# Patient Record
Sex: Female | Born: 1950 | Race: White | Hispanic: No | Marital: Married | Smoking: Former smoker
Health system: Southern US, Community
[De-identification: ages and names within clinical notes are randomized; demographics above are authoritative.]

## PROBLEM LIST (undated history)

## (undated) DIAGNOSIS — I10 Essential (primary) hypertension: Secondary | ICD-10-CM

## (undated) DIAGNOSIS — K219 Gastro-esophageal reflux disease without esophagitis: Secondary | ICD-10-CM

## (undated) DIAGNOSIS — E785 Hyperlipidemia, unspecified: Secondary | ICD-10-CM

## (undated) DIAGNOSIS — M1990 Unspecified osteoarthritis, unspecified site: Secondary | ICD-10-CM

## (undated) HISTORY — DX: Hyperlipidemia, unspecified: E78.5

## (undated) HISTORY — PX: TONSILLECTOMY: SUR1361

## (undated) HISTORY — PX: COLONOSCOPY: SHX174

## (undated) HISTORY — DX: Gastro-esophageal reflux disease without esophagitis: K21.9

## (undated) HISTORY — DX: Morbid (severe) obesity due to excess calories: E66.01

## (undated) HISTORY — DX: Essential (primary) hypertension: I10

---

## 1998-10-11 ENCOUNTER — Encounter: Admission: RE | Admit: 1998-10-11 | Discharge: 1998-11-03 | Payer: Self-pay | Admitting: Family Medicine

## 1999-01-05 ENCOUNTER — Other Ambulatory Visit: Admission: RE | Admit: 1999-01-05 | Discharge: 1999-01-05 | Payer: Self-pay | Admitting: Family Medicine

## 2000-04-12 ENCOUNTER — Encounter: Payer: Self-pay | Admitting: Family Medicine

## 2000-04-12 ENCOUNTER — Encounter: Admission: RE | Admit: 2000-04-12 | Discharge: 2000-04-12 | Payer: Self-pay | Admitting: Family Medicine

## 2000-04-16 ENCOUNTER — Other Ambulatory Visit: Admission: RE | Admit: 2000-04-16 | Discharge: 2000-04-16 | Payer: Self-pay | Admitting: Family Medicine

## 2001-01-15 ENCOUNTER — Encounter: Admission: RE | Admit: 2001-01-15 | Discharge: 2001-02-07 | Payer: Self-pay | Admitting: Family Medicine

## 2001-01-22 ENCOUNTER — Encounter: Admission: RE | Admit: 2001-01-22 | Discharge: 2001-04-03 | Payer: Self-pay | Admitting: Family Medicine

## 2001-04-15 ENCOUNTER — Encounter: Admission: RE | Admit: 2001-04-15 | Discharge: 2001-04-15 | Payer: Self-pay | Admitting: Family Medicine

## 2001-04-15 ENCOUNTER — Encounter: Payer: Self-pay | Admitting: Family Medicine

## 2001-04-17 ENCOUNTER — Encounter: Payer: Self-pay | Admitting: Family Medicine

## 2001-04-17 ENCOUNTER — Encounter: Admission: RE | Admit: 2001-04-17 | Discharge: 2001-04-17 | Payer: Self-pay | Admitting: Family Medicine

## 2001-04-18 ENCOUNTER — Other Ambulatory Visit: Admission: RE | Admit: 2001-04-18 | Discharge: 2001-04-18 | Payer: Self-pay | Admitting: Family Medicine

## 2001-05-27 ENCOUNTER — Encounter: Admission: RE | Admit: 2001-05-27 | Discharge: 2001-08-25 | Payer: Self-pay | Admitting: Family Medicine

## 2001-10-07 ENCOUNTER — Encounter: Payer: Self-pay | Admitting: Family Medicine

## 2001-10-07 ENCOUNTER — Encounter: Admission: RE | Admit: 2001-10-07 | Discharge: 2001-10-07 | Payer: Self-pay | Admitting: Family Medicine

## 2002-04-16 ENCOUNTER — Encounter: Admission: RE | Admit: 2002-04-16 | Discharge: 2002-04-16 | Payer: Self-pay | Admitting: Family Medicine

## 2002-04-16 ENCOUNTER — Encounter: Payer: Self-pay | Admitting: Family Medicine

## 2002-04-18 ENCOUNTER — Encounter: Payer: Self-pay | Admitting: Family Medicine

## 2002-04-18 ENCOUNTER — Encounter: Admission: RE | Admit: 2002-04-18 | Discharge: 2002-04-18 | Payer: Self-pay | Admitting: Family Medicine

## 2003-04-20 ENCOUNTER — Encounter: Payer: Self-pay | Admitting: Internal Medicine

## 2003-04-20 ENCOUNTER — Encounter: Admission: RE | Admit: 2003-04-20 | Discharge: 2003-04-20 | Payer: Self-pay | Admitting: Internal Medicine

## 2004-04-22 ENCOUNTER — Encounter: Admission: RE | Admit: 2004-04-22 | Discharge: 2004-04-22 | Payer: Self-pay | Admitting: Internal Medicine

## 2004-06-30 ENCOUNTER — Other Ambulatory Visit: Admission: RE | Admit: 2004-06-30 | Discharge: 2004-06-30 | Payer: Self-pay | Admitting: Internal Medicine

## 2004-07-08 ENCOUNTER — Encounter: Admission: RE | Admit: 2004-07-08 | Discharge: 2004-10-06 | Payer: Self-pay | Admitting: Internal Medicine

## 2004-07-26 ENCOUNTER — Other Ambulatory Visit: Admission: RE | Admit: 2004-07-26 | Discharge: 2004-07-26 | Payer: Self-pay | Admitting: Internal Medicine

## 2004-09-06 ENCOUNTER — Encounter (INDEPENDENT_AMBULATORY_CARE_PROVIDER_SITE_OTHER): Payer: Self-pay | Admitting: *Deleted

## 2004-09-06 ENCOUNTER — Ambulatory Visit (HOSPITAL_COMMUNITY): Admission: RE | Admit: 2004-09-06 | Discharge: 2004-09-06 | Payer: Self-pay | Admitting: Gastroenterology

## 2005-05-10 ENCOUNTER — Encounter: Admission: RE | Admit: 2005-05-10 | Discharge: 2005-05-10 | Payer: Self-pay | Admitting: Internal Medicine

## 2006-01-15 ENCOUNTER — Other Ambulatory Visit: Admission: RE | Admit: 2006-01-15 | Discharge: 2006-01-15 | Payer: Self-pay | Admitting: Internal Medicine

## 2006-02-01 ENCOUNTER — Encounter: Admission: RE | Admit: 2006-02-01 | Discharge: 2006-02-01 | Payer: Self-pay | Admitting: Internal Medicine

## 2006-05-11 ENCOUNTER — Encounter: Admission: RE | Admit: 2006-05-11 | Discharge: 2006-05-11 | Payer: Self-pay | Admitting: Obstetrics and Gynecology

## 2007-05-14 ENCOUNTER — Encounter: Admission: RE | Admit: 2007-05-14 | Discharge: 2007-05-14 | Payer: Self-pay | Admitting: Internal Medicine

## 2007-05-16 ENCOUNTER — Encounter: Admission: RE | Admit: 2007-05-16 | Discharge: 2007-05-16 | Payer: Self-pay | Admitting: Internal Medicine

## 2008-01-03 ENCOUNTER — Emergency Department (HOSPITAL_COMMUNITY): Admission: EM | Admit: 2008-01-03 | Discharge: 2008-01-04 | Payer: Self-pay | Admitting: Emergency Medicine

## 2008-05-25 ENCOUNTER — Encounter: Admission: RE | Admit: 2008-05-25 | Discharge: 2008-05-25 | Payer: Self-pay | Admitting: Internal Medicine

## 2009-05-27 ENCOUNTER — Encounter: Admission: RE | Admit: 2009-05-27 | Discharge: 2009-05-27 | Payer: Self-pay | Admitting: Internal Medicine

## 2010-05-02 ENCOUNTER — Ambulatory Visit: Payer: Self-pay | Admitting: Cardiology

## 2010-05-31 ENCOUNTER — Encounter: Admission: RE | Admit: 2010-05-31 | Discharge: 2010-05-31 | Payer: Self-pay | Admitting: Family Medicine

## 2010-06-02 ENCOUNTER — Encounter: Admission: RE | Admit: 2010-06-02 | Discharge: 2010-06-02 | Payer: Self-pay | Admitting: Family Medicine

## 2010-06-29 ENCOUNTER — Ambulatory Visit: Payer: Self-pay | Admitting: Cardiology

## 2010-09-25 ENCOUNTER — Encounter: Payer: Self-pay | Admitting: Family Medicine

## 2011-01-16 ENCOUNTER — Other Ambulatory Visit: Payer: Self-pay | Admitting: Cardiology

## 2011-01-16 DIAGNOSIS — K219 Gastro-esophageal reflux disease without esophagitis: Secondary | ICD-10-CM

## 2011-01-16 DIAGNOSIS — E785 Hyperlipidemia, unspecified: Secondary | ICD-10-CM

## 2011-01-16 DIAGNOSIS — I10 Essential (primary) hypertension: Secondary | ICD-10-CM

## 2011-01-16 MED ORDER — PRAVASTATIN SODIUM 20 MG PO TABS: 20.0000 mg | ORAL_TABLET | Freq: Every evening | ORAL | Status: DC

## 2011-01-16 MED ORDER — TELMISARTAN 80 MG PO TABS: 80.0000 mg | ORAL_TABLET | Freq: Every day | ORAL | Status: DC

## 2011-01-16 MED ORDER — ESOMEPRAZOLE MAGNESIUM 40 MG PO CPDR: 40.0000 mg | DELAYED_RELEASE_CAPSULE | Freq: Every day | ORAL | Status: DC

## 2011-01-16 NOTE — Telephone Encounter (Signed)
CALL PT ABOUT SOME SCRIPT SHE WANTS TO KNOW IF KELLY CAN TAKE CARE OF.

## 2011-01-20 NOTE — Op Note (Signed)
NAMETAYONNA, BACHA NO.:  [id number]   MEDICAL RECORD NO.:  [id number]          PATIENT TYPE:  AMB   LOCATION:  ENDO                         FACILITY:  [facility]   PHYSICIAN:  Danise Edge, M.D.   DATE OF BIRTH:  10/03/1950   DATE OF PROCEDURE:  09/06/2004  DATE OF DISCHARGE:                                 OPERATIVE REPORT   PROCEDURE:  Screening colonoscopy with polypectomy.   PROCEDURE INDICATION:  Kim Valdez is a 60 year old female born 04/21/1951.  __________ for screening colonoscopy with polypectomy to prevent  colon cancer.   ENDOSCOPIST:  Danise Edge, M.D.   PRE-MEDICATIONS:  Demerol 70 mg, Versed 7 mg.   PROCEDURE:  After obtaining informed consent, Kim Valdez was placed in the  left lateral decubitus position.  After __________ procedure.  The patient's  blood pressure, oxygen saturation and cardiac rhythm were monitored  throughout the procedure and documented in the medical record.   Anal inspection and digital rectal exam were normal.  The Olympus video  pediatric colonoscope was introduced into the rectum and advanced into the  cecum.  Colonic preparation is __________.   Rectum normal.  Sigmoid colon and descending colon were normal.  Splenic  flexure normal.  Transverse colon normal.  Hepatic flexure normal.  Ascending colon:  From the proximal ascending colon, a diminutive 1-mm  sessile polyp was removed with cold biopsy forceps.  Cecum and ileocecal  valve were normal.   ASSESSMENT:  A diminutive polyp was removed from the proximal ascending  colon; otherwise normal proctocolonoscopy to the cecum.      MJ/MEDQ  D:  09/06/2004  T:  09/06/2004  Job:  [id number]

## 2011-05-16 ENCOUNTER — Other Ambulatory Visit: Payer: Self-pay | Admitting: Family Medicine

## 2011-05-16 DIAGNOSIS — Z1231 Encounter for screening mammogram for malignant neoplasm of breast: Secondary | ICD-10-CM

## 2011-06-27 ENCOUNTER — Ambulatory Visit
Admission: RE | Admit: 2011-06-27 | Discharge: 2011-06-27 | Disposition: A | Discharge status: Home / Self Care | Source: Ambulatory Visit | Attending: Family Medicine | Admitting: Family Medicine

## 2011-06-27 DIAGNOSIS — Z1231 Encounter for screening mammogram for malignant neoplasm of breast: Secondary | ICD-10-CM

## 2011-12-06 ENCOUNTER — Encounter: Payer: Self-pay | Admitting: *Deleted

## 2012-07-01 ENCOUNTER — Other Ambulatory Visit: Payer: Self-pay | Admitting: Family Medicine

## 2012-07-01 DIAGNOSIS — Z1231 Encounter for screening mammogram for malignant neoplasm of breast: Secondary | ICD-10-CM

## 2012-07-29 ENCOUNTER — Ambulatory Visit
Admission: RE | Admit: 2012-07-29 | Discharge: 2012-07-29 | Disposition: A | Discharge status: Home / Self Care | Source: Ambulatory Visit | Attending: Family Medicine | Admitting: Family Medicine

## 2012-07-29 DIAGNOSIS — Z1231 Encounter for screening mammogram for malignant neoplasm of breast: Secondary | ICD-10-CM

## 2012-08-02 ENCOUNTER — Other Ambulatory Visit: Payer: Self-pay | Admitting: Family Medicine

## 2012-08-02 DIAGNOSIS — R928 Other abnormal and inconclusive findings on diagnostic imaging of breast: Secondary | ICD-10-CM

## 2012-08-06 ENCOUNTER — Ambulatory Visit
Admission: RE | Admit: 2012-08-06 | Discharge: 2012-08-06 | Disposition: A | Discharge status: Home / Self Care | Payer: 59 | Source: Ambulatory Visit | Attending: Family Medicine | Admitting: Family Medicine

## 2012-08-06 DIAGNOSIS — R928 Other abnormal and inconclusive findings on diagnostic imaging of breast: Secondary | ICD-10-CM

## 2012-11-03 ENCOUNTER — Emergency Department (HOSPITAL_COMMUNITY)
Admission: EM | Admit: 2012-11-03 | Discharge: 2012-11-04 | Disposition: A | Discharge status: Home / Self Care | Payer: 59 | Attending: Emergency Medicine | Admitting: Emergency Medicine

## 2012-11-03 ENCOUNTER — Encounter (HOSPITAL_COMMUNITY): Payer: Self-pay | Admitting: Emergency Medicine

## 2012-11-03 DIAGNOSIS — K5289 Other specified noninfective gastroenteritis and colitis: Secondary | ICD-10-CM | POA: Insufficient documentation

## 2012-11-03 DIAGNOSIS — R197 Diarrhea, unspecified: Secondary | ICD-10-CM | POA: Insufficient documentation

## 2012-11-03 DIAGNOSIS — Z87891 Personal history of nicotine dependence: Secondary | ICD-10-CM | POA: Insufficient documentation

## 2012-11-03 DIAGNOSIS — R112 Nausea with vomiting, unspecified: Secondary | ICD-10-CM | POA: Insufficient documentation

## 2012-11-03 DIAGNOSIS — K529 Noninfective gastroenteritis and colitis, unspecified: Secondary | ICD-10-CM

## 2012-11-03 DIAGNOSIS — K219 Gastro-esophageal reflux disease without esophagitis: Secondary | ICD-10-CM | POA: Insufficient documentation

## 2012-11-03 DIAGNOSIS — Z79899 Other long term (current) drug therapy: Secondary | ICD-10-CM | POA: Insufficient documentation

## 2012-11-03 DIAGNOSIS — E785 Hyperlipidemia, unspecified: Secondary | ICD-10-CM | POA: Insufficient documentation

## 2012-11-03 DIAGNOSIS — I10 Essential (primary) hypertension: Secondary | ICD-10-CM | POA: Insufficient documentation

## 2012-11-03 DIAGNOSIS — Z7982 Long term (current) use of aspirin: Secondary | ICD-10-CM | POA: Insufficient documentation

## 2012-11-03 MED ORDER — SODIUM CHLORIDE 0.9 % IV BOLUS (SEPSIS)
1000.0000 mL | Freq: Once | INTRAVENOUS | Status: AC
  Administered 2012-11-04: 1000 mL via INTRAVENOUS

## 2012-11-03 MED ORDER — AMMONIA AROMATIC IN INHA
RESPIRATORY_TRACT | Status: AC
  Filled 2012-11-03: qty 10

## 2012-11-03 MED ORDER — ONDANSETRON HCL 4 MG/2ML IJ SOLN
4.0000 mg | Freq: Once | INTRAMUSCULAR | Status: AC
  Administered 2012-11-04: 4 mg via INTRAVENOUS
  Filled 2012-11-03: qty 2

## 2012-11-03 NOTE — ED Notes (Signed)
Pt c/o N/Vx2 day,  diarrhea  X3days. Pt took zofran to control nausea and had some relief.

## 2012-11-04 ENCOUNTER — Encounter (HOSPITAL_COMMUNITY): Payer: Self-pay

## 2012-11-04 ENCOUNTER — Emergency Department (HOSPITAL_COMMUNITY): Payer: 59

## 2012-11-04 LAB — COMPREHENSIVE METABOLIC PANEL
Albumin: 3.3 g/dL — ABNORMAL LOW (ref 3.5–5.2)
Alkaline Phosphatase: 90 U/L (ref 39–117)
BUN: 17 mg/dL (ref 6–23)
Calcium: 8.7 mg/dL (ref 8.4–10.5)
Creatinine, Ser: 0.89 mg/dL (ref 0.50–1.10)
GFR calc Af Amer: 79 mL/min — ABNORMAL LOW (ref >90)
Glucose, Bld: 127 mg/dL — ABNORMAL HIGH (ref 70–99)
Total Protein: 7.3 g/dL (ref 6.0–8.3)

## 2012-11-04 LAB — CBC WITH DIFFERENTIAL/PLATELET
Basophils Absolute: 0 10*3/uL (ref 0.0–0.1)
HCT: 41.1 % (ref 36.0–46.0)
Hemoglobin: 13.9 g/dL (ref 12.0–15.0)
Lymphocytes Relative: 16 % (ref 12–46)
Lymphs Abs: 1.3 10*3/uL (ref 0.7–4.0)
Monocytes Absolute: 1 10*3/uL (ref 0.1–1.0)
Neutro Abs: 5.6 10*3/uL (ref 1.7–7.7)
RBC: 4.75 MIL/uL (ref 3.87–5.11)
RDW: 14.4 % (ref 11.5–15.5)
WBC: 7.9 10*3/uL (ref 4.0–10.5)

## 2012-11-04 LAB — LIPASE, BLOOD: Lipase: 18 U/L (ref 11–59)

## 2012-11-04 MED ORDER — PROMETHAZINE HCL 25 MG PO TABS: 25.0000 mg | ORAL_TABLET | Freq: Four times a day (QID) | ORAL | Status: DC | PRN

## 2012-11-04 MED ORDER — DIPHENOXYLATE-ATROPINE 2.5-0.025 MG PO TABS: 1.0000 | ORAL_TABLET | Freq: Four times a day (QID) | ORAL | Status: DC | PRN

## 2012-11-04 MED ORDER — POTASSIUM CHLORIDE CRYS ER 20 MEQ PO TBCR
40.0000 meq | EXTENDED_RELEASE_TABLET | Freq: Once | ORAL | Status: AC
  Administered 2012-11-04: 40 meq via ORAL
  Filled 2012-11-04: qty 2

## 2012-11-04 MED ORDER — IOHEXOL 300 MG/ML  SOLN
50.0000 mL | Freq: Once | INTRAMUSCULAR | Status: AC | PRN
  Administered 2012-11-04: 50 mL via ORAL

## 2012-11-04 MED ORDER — IOHEXOL 300 MG/ML  SOLN
100.0000 mL | Freq: Once | INTRAMUSCULAR | Status: AC | PRN
  Administered 2012-11-04: 100 mL via INTRAVENOUS

## 2012-11-04 MED ORDER — DIPHENOXYLATE-ATROPINE 2.5-0.025 MG PO TABS
2.0000 | ORAL_TABLET | Freq: Once | ORAL | Status: AC
  Administered 2012-11-04: 2 via ORAL
  Filled 2012-11-04: qty 2

## 2012-11-04 NOTE — ED Provider Notes (Signed)
History     CSN: [id number]  Arrival date & time 11/03/12  2304   First MD Initiated Contact with Patient 11/03/12 2310      Chief Complaint  Patient presents with  . Abdominal Pain    (Consider location/radiation/quality/duration/timing/severity/associated sxs/prior treatment) HPI... nausea, vomiting, diarrhea for 2-3 days with associated abdominal cramping. Has been urinating. No fever, sweats, chills, dysuria. Nothing makes symptoms better or worse. Severity is mild to moderate. No radiation of abdominal discomfort  Past Medical History  Diagnosis Date  . Morbid obesity   . HTN (hypertension)   . HLD (hyperlipidemia)   . GERD (gastroesophageal reflux disease)     History reviewed. No pertinent past surgical history.  Family History  Problem Relation Age of Onset  . Coronary artery disease    . Heart attack    . Hypertension    . Anxiety disorder    . Ulcers    . Cataracts      History  Substance Use Topics  . Smoking status: Former Smoker -- 4 years    Quit date: 09/05/1971  . Smokeless tobacco: Not on file  . Alcohol Use: No    OB History   Grav Para Term Preterm Abortions TAB SAB Ect Mult Living                  Review of Systems  All other systems reviewed and are negative.    Allergies  Review of patient's allergies indicates no known allergies.  Home Medications   Current Outpatient Rx  Name  Route  Sig  Dispense  Refill  . loperamide (IMODIUM) 2 MG capsule   Oral   Take 2 mg by mouth 4 (four) times daily as needed for diarrhea or loose stools.         . ondansetron (ZOFRAN-ODT) 4 MG disintegrating tablet   Oral   Take 4 mg by mouth every 8 (eight) hours as needed for nausea.         Marland Kitchen telmisartan-hydrochlorothiazide (MICARDIS HCT) 80-25 MG per tablet   Oral   Take 1 tablet by mouth daily.         Marland Kitchen aspirin 81 MG tablet   Oral   Take 81 mg by mouth daily.         . Calcium Carbonate-Vitamin D (CALCIUM + D PO)   Oral    Take by mouth daily.         . diphenoxylate-atropine (LOMOTIL) 2.5-0.025 MG per tablet   Oral   Take 1 tablet by mouth 4 (four) times daily as needed for diarrhea or loose stools.   20 tablet   0   . EXPIRED: esomeprazole (NEXIUM) 40 MG capsule   Oral   Take 1 capsule (40 mg total) by mouth daily before breakfast.   90 capsule   1   . Multiple Vitamin (MULTIVITAMIN) tablet   Oral   Take 1 tablet by mouth daily.         Marland Kitchen EXPIRED: pravastatin (PRAVACHOL) 20 MG tablet   Oral   Take 1 tablet (20 mg total) by mouth every evening.   90 tablet   0   . promethazine (PHENERGAN) 25 MG tablet   Oral   Take 1 tablet (25 mg total) by mouth every 6 (six) hours as needed for nausea.   20 tablet   0     BP 122/64  Pulse 80  Temp(Src) 98.9 F (37.2 C) (Oral)  Resp 18  SpO2  100%  Physical Exam  Nursing note and vitals reviewed. Constitutional: She is oriented to person, place, and time. She appears well-developed and well-nourished.  HENT:  Head: Normocephalic and atraumatic.  Eyes: Conjunctivae and EOM are normal. Pupils are equal, round, and reactive to light.  Neck: Normal range of motion. Neck supple.  Cardiovascular: Normal rate, regular rhythm and normal heart sounds.   Pulmonary/Chest: Effort normal and breath sounds normal.  Abdominal: Soft. Bowel sounds are normal.  Very minimal diffuse tenderness  Musculoskeletal: Normal range of motion.  Neurological: She is alert and oriented to person, place, and time.  Skin: Skin is warm and dry.  Psychiatric: She has a normal mood and affect.    ED Course  Procedures (including critical care time)  Labs Reviewed  COMPREHENSIVE METABOLIC PANEL - Abnormal; Notable for the following:    Potassium 3.1 (*)    Glucose, Bld 127 (*)    Albumin 3.3 (*)    AST 47 (*)    ALT 75 (*)    Total Bilirubin 0.2 (*)    GFR calc non Af Amer 69 (*)    GFR calc Af Amer 79 (*)    All other components within normal limits  CBC WITH  DIFFERENTIAL  LIPASE, BLOOD   Ct Abdomen Pelvis W Contrast  11/04/2012  *RADIOLOGY REPORT*  Clinical Data: Diffuse abdominal pain, nausea, vomiting and diarrhea.  Elevated LFTs.  CT ABDOMEN AND PELVIS WITH CONTRAST  Technique:  Multidetector CT imaging of the abdomen and pelvis was performed following the standard protocol during bolus administration of intravenous contrast.  Contrast: OMNIPAQUE IOHEXOL 300 MG/ML  SOLN  Comparison: None.  Findings: The visualized lung bases are clear.  There is diffuse fatty infection within the liver, with sparing at the caudate lobe.  The spleen is unremarkable in appearance.  The gallbladder is within normal limits.  The pancreas and adrenal glands are unremarkable.  The kidneys are unremarkable in appearance.  There is no evidence of hydronephrosis.  No renal or ureteral stones are seen.  No perinephric stranding is appreciated.  No free fluid is identified.  The small bowel is unremarkable in appearance.  The stomach is within normal limits.  No acute vascular abnormalities are seen.  Mild scattered calcification is noted along the abdominal aorta.  The appendix is normal in caliber and contains contrast, without evidence for appendicitis.  Contrast progresses to the level of the rectum.  The colon is unremarkable in appearance.  The bladder is mildly distended; apparent mildly high-density material within the bladder could reflect debris or possibly a ureteral jet.  The uterus is unremarkable in appearance.  The ovaries are grossly unremarkable in appearance.  No suspicious adnexal masses are seen.  No inguinal lymphadenopathy is seen.  No acute osseous abnormalities are identified.  IMPRESSION:  1.  No acute abnormality seen in the abdomen or pelvis. 2.  Apparent mildly high-density material within the bladder could reflect debris or possibly a ureteral jet.  Bladder otherwise unremarkable in appearance. 3.  Diffuse fatty infiltration within the liver. 4.  Mild  scattered calcification along the abdominal aorta.   Original Report Authenticated By: Tonia Ghent, M.D.      1. Gastroenteritis       MDM  CT abdomen reveals no acute pathology. Mildly elevated liver functions noted. This was discussed with patient and her husband. No acute abdomen at discharge.  Patient feeling better after IV fluids.       Donnetta Hutching, MD  11/04/12 2358 

## 2012-11-09 ENCOUNTER — Ambulatory Visit (INDEPENDENT_AMBULATORY_CARE_PROVIDER_SITE_OTHER): Admitting: Family Medicine

## 2012-11-09 VITALS — BP 150/75 | HR 88 | Temp 98.9°F | Resp 18 | Wt 245.0 lb

## 2012-11-09 DIAGNOSIS — E860 Dehydration: Secondary | ICD-10-CM

## 2012-11-09 DIAGNOSIS — R197 Diarrhea, unspecified: Secondary | ICD-10-CM

## 2012-11-09 LAB — POCT UA - MICROSCOPIC ONLY
Crystals, Ur, HPF, POC: NEGATIVE
Yeast, UA: NEGATIVE

## 2012-11-09 LAB — POCT CBC
HCT, POC: 45.2 % (ref 37.7–47.9)
Lymph, poc: 1.6 (ref 0.6–3.4)
MCHC: 31.9 g/dL (ref 31.8–35.4)
MCV: 90 fL (ref 80–97)
MID (cbc): 0.6 (ref 0–0.9)
POC Granulocyte: 5.3 (ref 2–6.9)
POC LYMPH PERCENT: 20.8 %L (ref 10–50)
RDW, POC: 15.7 %

## 2012-11-09 LAB — POCT URINALYSIS DIPSTICK
Leukocytes, UA: NEGATIVE
Nitrite, UA: NEGATIVE
Protein, UA: 100
Urobilinogen, UA: 0.2
pH, UA: 5.5

## 2012-11-09 MED ORDER — DICYCLOMINE HCL 10 MG PO CAPS: ORAL_CAPSULE | ORAL | Status: DC

## 2012-11-09 NOTE — Addendum Note (Signed)
Addended by: HOPPER, DAVID H on: 11/09/2012 12:25 PM   Modules accepted: Level of Service

## 2012-11-09 NOTE — Patient Instructions (Addendum)
Push fluids. Nonalcoholic, non-dairy.  Take the Bentyl one pill before meals and at bedtime. If no relief she can increase to 2 pills at a time. It will cause dry mouth often. Taper off the Bentyl when your diarrhea is slowing down.  Return if fevers, worse abdominal pains, or just not improving.  Are let you know the results of your other tests in a couple of days.

## 2012-11-09 NOTE — Progress Notes (Signed)
Subjective: 62 year old lady who has a history of 9 days of gastrointestinal problems. She started with nausea and vomiting and diarrhea about same kind. It hit her heart. She went constantly the next day. She was seen in the emergency room last weekend and given some IV fluids. She had a low potassium and little elevation of her ALT and AST. I gave her prescriptions for Phenergan and Lomotil. However she is persisted with diarrhea all week. She did not have any more vomiting and nausea now. She does not have any fever. No abdominal pain to speak of.  Objective: Obese white female in no major distress. Looks fairly healthy. Her TMs normal. Throat clear. Neck supple without nodes thyromegaly. Chest is clear to auscultation. Heart regular without murmurs. Abdomen has normal bowel sounds. Is soft. Mild epigastric tenderness.  Assessment: Gastroenteritis  Plan: CBC C. met Urinalysis  Results for orders placed in visit on 11/09/12  POCT CBC      Result Value Range   WBC 7.5  4.6 - 10.2 K/uL   Lymph, poc 1.6  0.6 - 3.4   POC LYMPH PERCENT 20.8  10 - 50 %L   MID (cbc) 0.6  0 - 0.9   POC MID % 8.6  0 - 12 %M   POC Granulocyte 5.3  2 - 6.9   Granulocyte percent 70.6  37 - 80 %G   RBC 5.02  4.04 - 5.48 M/uL   Hemoglobin 14.4  12.2 - 16.2 g/dL   HCT, POC 04.5  40.9 - 47.9 %   MCV 90.0  80 - 97 fL   MCH, POC 28.7  27 - 31.2 pg   MCHC 31.9  31.8 - 35.4 g/dL   RDW, POC 81.1     Platelet Count, POC 337  142 - 424 K/uL   MPV 9.4  0 - 99.8 fL  POCT UA - MICROSCOPIC ONLY      Result Value Range   WBC, Ur, HPF, POC 0-2     RBC, urine, microscopic neg     Bacteria, U Microscopic trace     Mucus, UA neg     Epithelial cells, urine per micros 4-6     Crystals, Ur, HPF, POC neg     Casts, Ur, LPF, POC neg     Yeast, UA neg    POCT URINALYSIS DIPSTICK      Result Value Range   Color, UA yellow     Clarity, UA cloudy     Glucose, UA neg     Bilirubin, UA moderate     Ketones, UA trace     Spec Grav, UA >=1.030     Blood, UA neg     pH, UA 5.5     Protein, UA 100     Urobilinogen, UA 0.2     Nitrite, UA neg     Leukocytes, UA Negative     CBC is normal. The urinalysis would indicate moderate dehydration. Encourage her to push fluids. We'll try to slow her bowels down and treat symptomatically. If she's getting worse she needs to return again.

## 2012-11-11 LAB — COMPREHENSIVE METABOLIC PANEL
ALT: 39 U/L — ABNORMAL HIGH (ref 0–35)
AST: 25 U/L (ref 0–37)
Creat: 1.03 mg/dL (ref 0.50–1.10)
Total Bilirubin: 0.4 mg/dL (ref 0.3–1.2)

## 2012-11-13 ENCOUNTER — Telehealth: Payer: Self-pay

## 2012-11-13 ENCOUNTER — Encounter: Payer: Self-pay | Admitting: *Deleted

## 2012-11-13 NOTE — Telephone Encounter (Signed)
Labs normal. Letter mailed. Husband advised , he is on HIPAA

## 2012-11-13 NOTE — Telephone Encounter (Signed)
Pt would like to know lab results, pt states that she was informed that they would be in on Sunday by Dr.Hopper. Best# [phone]

## 2013-07-02 ENCOUNTER — Other Ambulatory Visit: Payer: Self-pay

## 2013-07-02 DIAGNOSIS — Z1231 Encounter for screening mammogram for malignant neoplasm of breast: Secondary | ICD-10-CM

## 2013-08-05 ENCOUNTER — Ambulatory Visit: Payer: 59

## 2013-08-05 ENCOUNTER — Ambulatory Visit
Admission: RE | Admit: 2013-08-05 | Discharge: 2013-08-05 | Disposition: A | Discharge status: Home / Self Care | Payer: 59 | Source: Ambulatory Visit

## 2013-08-05 DIAGNOSIS — Z1231 Encounter for screening mammogram for malignant neoplasm of breast: Secondary | ICD-10-CM

## 2014-01-08 ENCOUNTER — Telehealth (INDEPENDENT_AMBULATORY_CARE_PROVIDER_SITE_OTHER): Payer: Self-pay | Admitting: General Surgery

## 2014-01-08 ENCOUNTER — Ambulatory Visit (INDEPENDENT_AMBULATORY_CARE_PROVIDER_SITE_OTHER): Payer: 59 | Admitting: General Surgery

## 2014-01-08 ENCOUNTER — Encounter (INDEPENDENT_AMBULATORY_CARE_PROVIDER_SITE_OTHER): Payer: Self-pay | Admitting: General Surgery

## 2014-01-08 VITALS — BP 144/88 | HR 76 | Temp 98.4°F | Resp 16 | Ht 63.0 in | Wt 259.6 lb

## 2014-01-08 DIAGNOSIS — K8020 Calculus of gallbladder without cholecystitis without obstruction: Secondary | ICD-10-CM

## 2014-01-08 DIAGNOSIS — I10 Essential (primary) hypertension: Secondary | ICD-10-CM

## 2014-01-08 DIAGNOSIS — K219 Gastro-esophageal reflux disease without esophagitis: Secondary | ICD-10-CM

## 2014-01-08 NOTE — Patient Instructions (Signed)
You have gallstones, and inflamed gallbladder, and you're having recurrent gallbladder attacks.  He also had tenderness of urea of this, and I suspected to have a chronic inflammatory condition call costochondritis. This can be treated with anti-inflammatories under the guidance of your primary care physician.  The closure gallbladder Attacks will almost certainly progress, he will be scheduled for a laparoscopic cholecystectomy with cholangiogram, possible open.  Follow  a very low-fat diet until we do the surgery.        Laparoscopic Cholecystectomy Laparoscopic cholecystectomy is surgery to remove the gallbladder. The gallbladder is located in the upper right part of the abdomen, behind the liver. It is a storage sac for bile produced in the liver. Bile aids in the digestion and absorption of fats. Cholecystectomy is often done for inflammation of the gallbladder (cholecystitis). This condition is usually caused by a buildup of gallstones (cholelithiasis) in your gallbladder. Gallstones can block the flow of bile, resulting in inflammation and pain. In severe cases, emergency surgery may be required. When emergency surgery is not required, you will have time to prepare for the procedure. Laparoscopic surgery is an alternative to open surgery. Laparoscopic surgery has a shorter recovery time. Your common bile duct may also need to be examined during the procedure. If stones are found in the common bile duct, they may be removed. LET [facility] PROVIDER KNOW ABOUT:  Any allergies you have.  All medicines you are taking, including vitamins, herbs, eye drops, creams, and over-the-counter medicines.  Previous problems you or members of your family have had with the use of anesthetics.  Any blood disorders you have.  Previous surgeries you have had.  Medical conditions you have. RISKS AND COMPLICATIONS Generally, this is a safe procedure. However, as with any procedure,  complications can occur. Possible complications include:  Infection.  Damage to the common bile duct, nerves, arteries, veins, or other internal organs such as the stomach, liver, or intestines.  Bleeding.  A stone may remain in the common bile duct.  A bile leak from the cyst duct that is clipped when your gallbladder is removed.  The need to convert to open surgery, which requires a larger incision in the abdomen. This may be necessary if your surgeon thinks it is not safe to continue with a laparoscopic procedure. BEFORE THE PROCEDURE  Ask your health care provider about changing or stopping any regular medicines. You will need to stop taking aspirin or blood thinners at least 5 days prior to surgery.  Do not eat or drink anything after midnight the night before surgery.  Let your health care provider know if you develop a cold or other infectious problem before surgery. PROCEDURE   You will be given medicine to make you sleep through the procedure (general anesthetic). A breathing tube will be placed in your mouth.  When you are asleep, your surgeon will make several small cuts (incisions) in your abdomen.  A thin, lighted tube with a tiny camera on the end (laparoscope) is inserted through one of the small incisions. The camera on the laparoscope sends a picture to a TV screen in the operating room. This gives the surgeon a good view inside your abdomen.  A gas will be pumped into your abdomen. This expands your abdomen so that the surgeon has more room to perform the surgery.  Other tools needed for the procedure are inserted through the other incisions. The gallbladder is removed through one of the incisions.  After the removal  of your gallbladder, the incisions will be closed with stitches, staples, or skin glue. AFTER THE PROCEDURE  You will be taken to a recovery area where your progress will be checked often.  You may be allowed to go home the same day if your pain is  controlled and you can tolerate liquids. Document Released: 08/21/2005 Document Revised: 06/11/2013 Document Reviewed: 04/02/2013 [facility] Patient Information 2014 [place], [place].

## 2014-01-08 NOTE — Telephone Encounter (Signed)
Spoke with pt and informed her of her 1st po appt of lap chole being on 02/04/14 at 3:45 with an arrival time of 3:30.

## 2014-01-08 NOTE — Progress Notes (Addendum)
Patient ID: Kim SilenceConnye B Bridwell, female   DOB: 1951/01/26, 63 y.o.   MRN: [id number]  Chief Complaint  Patient presents with  . Other    new pt- eval inflammed GB    HPI Kim Valdez is a 63 y.o. female.  [facility] symptomatic [facility]stones.  This patient has a one-year history of intermittent episodes of right upper quadrant and right back pain, right shoulder pain, nausea vomiting and diarrhea. She underwent evaluation in the emergency room one year ago but no diagnosis was made. In the interim she'll have episodes off and on, often but not always postprandial. One week ago she developed another mildly severe attack of this pain and with nausea and vomiting after eating a heavy meal at church. Ultrasound report shows gallstones and gallbladder wall thickening at 6 mm, positive sonographic Murphy sign, CBD measuring 6 mm. No other abnormalities were listed. She had lab work drawn yesterday, we called, and results are not completed yet. She says she's doing okay today. Ate Chili last night without problems, still has some right flank pain. She says she has  chronic right flank pain and has seen a chiropractor in the past.  No history of liver disease cardiovascular disease other than hypertension, abdominal surgery, or urologic problems.  Comorbidities include a hyperlipidemia, hypertension, GERD, and obesity. HPI  Past Medical History  Diagnosis Date  . Morbid obesity   . HTN (hypertension)   . HLD (hyperlipidemia)   . GERD (gastroesophageal reflux disease)     History reviewed. No pertinent past surgical history.  Family History  Problem Relation Age of Onset  . Coronary artery disease    . Heart attack    . Hypertension    . Anxiety disorder    . Ulcers    . Cataracts    . Asthma Mother   . Heart disease Father     Social History History  Substance Use Topics  . Smoking status: Former Smoker -- 4 years     Quit date: 09/05/1971  . Smokeless tobacco: Not on file  . Alcohol Use: No    No Known Allergies  Current Outpatient Prescriptions  Medication Sig Dispense Refill  . aspirin 81 MG tablet Take 81 mg by mouth daily.      . Calcium Carbonate-Vitamin D (CALCIUM + D PO) Take by mouth daily.      Marland Kitchen. esomeprazole (NEXIUM) 40 MG capsule Take 40 mg by mouth daily before breakfast.      . magnesium oxide (MAG-OX) 400 MG tablet Take 400 mg by mouth daily.      . Multiple Vitamin (MULTIVITAMIN) tablet Take 1 tablet by mouth daily.      Marland Kitchen. telmisartan-hydrochlorothiazide (MICARDIS HCT) 80-25 MG per tablet Take 1 tablet by mouth daily.       No current facility-administered medications for this visit.    Review of Systems Review of Systems  Constitutional: Negative for fever, chills and unexpected weight change.  HENT: Negative for congestion, hearing loss, sore throat, trouble swallowing and voice change.   Eyes: Negative for visual disturbance.  Respiratory: Negative for cough and wheezing.   Cardiovascular: Negative for chest pain, palpitations and leg swelling.  Gastrointestinal: Positive for nausea, vomiting, abdominal pain and diarrhea. Negative for constipation, blood in stool, abdominal distention and anal bleeding.  Genitourinary: Negative for hematuria, vaginal bleeding and difficulty urinating.  Musculoskeletal: Positive for back pain. Negative for arthralgias.  Skin: Negative for rash and wound.  Neurological: Negative for seizures, syncope and headaches.  Hematological: Negative for adenopathy. Does not bruise/bleed easily.  Psychiatric/Behavioral: Negative for confusion.    Blood pressure 144/88, pulse 76, temperature 98.4 F (36.9 C), temperature source Oral, resp. rate 16, height 5\' 3"  (1.6 m), weight 259 lb 9.6 oz (117.754 kg).  Physical Exam Physical Exam  Constitutional: She is oriented to person, place, and time. She appears well-developed and well-nourished. No distress.   BMI 45.99  HENT:  Head: Normocephalic and atraumatic.  Nose: Nose normal.  Mouth/Throat: No oropharyngeal exudate.  Eyes: Conjunctivae and EOM are normal. Pupils are equal, round, and reactive to light. Left eye exhibits no discharge. No scleral icterus.  Neck: Neck supple. No JVD present. No tracheal deviation present. No thyromegaly present.  Cardiovascular: Normal rate, regular rhythm, normal heart sounds and intact distal pulses.   No murmur heard. Pulmonary/Chest: Effort normal and breath sounds normal. No respiratory distress. She has no wheezes. She has no rales. She exhibits no tenderness.  Abdominal: Soft. Bowel sounds are normal. She exhibits no distension and no mass. There is no tenderness. There is no rebound and no guarding.  Right costal margin is tender to palpation, suggesting costochondritis. Abdomen is generally soft and there is no mass or peritoneal inflammation in the right upper quadrant, although subjectively she says it hurts a little bit to palpate deeply. No guarding or rebound.  Musculoskeletal: She exhibits no edema and no tenderness.  Lymphadenopathy:    She has no cervical adenopathy.  Neurological: She is alert and oriented to person, place, and time. She exhibits normal muscle tone. Coordination normal.  Skin: Skin is warm. No rash noted. She is not diaphoretic. No erythema. No pallor.  Psychiatric: She has a normal mood and affect. Her behavior is normal. Judgment and thought content normal.    Data Reviewed Ultrasound report. Office notes from PCP. Lab work requested.  Assessment    Chronic cholecystitis with cholelithiasis. It sounds like she is having accelerating biliary colic and may be resolving an attack of acute cholecystitis.  Suspect costochondritis, right costal margin  Morbid obesity, BMI 45.99  GERD  Hypertension  Hyperlipidemia     Plan    I discussed all of her diagnoses with her. I told her that it was advisable to proceed  with a lower operation in the near future. I told her that her pain from her costochondritis might persist, however. She would like to go ahead with gallbladder surgery  She'll be scheduled for laparoscopic cholecystectomy with cholangiogram, possible open.  Discussed indications, details, techniques, and numerous risks of the surgery with her and her husband. She is aware of the risk of bleeding, infection, conversion to open laparotomy, I'll leak, injury to adjacent organs with major reconstructive surgery, wound hernia, pancreatitis, diarrhea, and other unforeseen problems. She understands these issues well. At this time all of her questions were answered. She is in agreement with this plan.  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 I told her it  be reasonable to do that and have a gallbladder operation afterwards. Low-fat diet stressed.        Angelia MouldHaywood M. Derrell LollingIngram, M.D., [facility] General and Minimally invasive Surgery Breast and Colorectal Surgery Office:   [phone] Pager:   [phone]  01/08/2014, 10:41 AM

## 2014-01-08 NOTE — Telephone Encounter (Signed)
Message copied by Ignacia MarvelMOFFITT, KENDALL on Thu Jan 08, 2014 12:23 PM ------      Message from: Docia ChuckEVERHART, DEBORAH      Created: Thu Jan 08, 2014 11:20 AM      Regarding: Follow up appointment needed       I have her surgery scheduled for 5/19 with Dr. Derrell LollingIngram.  I can not find a follow up appointment.  I have let her know you would be calling with that if you can find a date.  If you don't call her I told her when she is discharged to call up here and they will get that follow up set up.            Thanks. ------

## 2014-01-18 NOTE — H&P (Signed)
Kim Valdez   MRN:  [id number]   Description: [age] year old female  Provider: Ernestene MentionHaywood M Sirena Riddle, MD  Department: [facility]     Diagnoses      Gallstones    -  Primary      574.20      Morbid obesity          278.01      Essential hypertension, benign          401.1      GERD (gastroesophageal reflux disease)          530.81                 Current Vitals Most recent update: 01/08/2014  9:36 AM by Ignacia MarvelKendall Moffitt      BP Pulse Temp(Src) Resp Ht Wt      144/88 76 98.4 F (36.9 C) (Oral) 16 5\' 3"  (1.6 m) 259 lb 9.6 oz (117.754 kg)      BMI 46.00 kg/m2                      History and Physical   Ernestene MentionHaywood M Kim Lybrand, MD    Status: Signed            Patient ID: Kim Valdez, female   DOB: [birth year], 63 y.o.   MRN: [id number]             Note: This dictation was prepared with Dragon/digital dictation along with [facility] technology. Any transcriptional errors that result from this process are unintentional.   HPI Kim SilenceConnye B Plazola is a 63 y.o. female.  She is referred by [facility]Boyd [facility] symptomatic [facility].   This patient has a one-year history of intermittent episodes of right upper quadrant and right back pain, right shoulder pain, nausea vomiting and diarrhea. She underwent evaluation in the emergency room one year ago but no diagnosis was made. In the interim she'll have episodes off and on, often but not always postprandial. One week ago she developed another mildly severe attack of this pain and with nausea and vomiting after eating a heavy meal at church. Ultrasound report shows gallstones and gallbladder wall thickening at 6 mm, positive sonographic Murphy sign, CBD measuring 6 mm. No other abnormalities were listed. She had lab work drawn yesterday, we called, and results are not completed yet. She says she's doing okay today. Ate Chili last night without problems, still has some right flank pain. She  says she has  chronic right flank pain and has seen a chiropractor in the past.   No history of liver disease cardiovascular disease other than hypertension, abdominal surgery, or urologic problems.   Comorbidities include a hyperlipidemia, hypertension, GERD, and obesity.        Past Medical History   Diagnosis  Date   .  Morbid obesity     .  HTN (hypertension)     .  HLD (hyperlipidemia)     .  GERD (gastroesophageal reflux disease)          History reviewed. No pertinent past surgical history.    Family History   Problem  Relation  Age of Onset   .  Coronary artery disease       .  Heart attack       .  Hypertension       .  Anxiety disorder       .  Ulcers       .  Cataracts       .  Asthma  Mother     .  Heart disease  Father          Social History History   Substance Use Topics   .  Smoking status:  Former Smoker -- 4 years       Quit date:  09/05/1971   .  Smokeless tobacco:  Not on file   .  Alcohol Use:  No        No Known Allergies    Current Outpatient Prescriptions   Medication  Sig  Dispense  Refill   .  aspirin 81 MG tablet  Take 81 mg by mouth daily.         .  Calcium Carbonate-Vitamin D (CALCIUM + D PO)  Take by mouth daily.         Marland Kitchen.  esomeprazole (NEXIUM) 40 MG capsule  Take 40 mg by mouth daily before breakfast.         .  magnesium oxide (MAG-OX) 400 MG tablet  Take 400 mg by mouth daily.         .  Multiple Vitamin (MULTIVITAMIN) tablet  Take 1 tablet by mouth daily.         Marland Kitchen.  telmisartan-hydrochlorothiazide (MICARDIS HCT) 80-25 MG per tablet  Take 1 tablet by mouth daily.              ROS:   Constitutional: Negative for fever, chills and unexpected weight change.  HENT: Negative for congestion, hearing loss, sore throat, trouble swallowing and voice change.   Eyes: Negative for visual disturbance.  Respiratory: Negative for cough and wheezing.   Cardiovascular: Negative for chest pain, palpitations and leg swelling.   Gastrointestinal: Positive for nausea, vomiting, abdominal pain and diarrhea. Negative for constipation, blood in stool, abdominal distention and anal bleeding.  Genitourinary: Negative for hematuria, vaginal bleeding and difficulty urinating.  Musculoskeletal: Positive for back pain. Negative for arthralgias.  Skin: Negative for rash and wound.  Neurological: Negative for seizures, syncope and headaches.  Hematological: Negative for adenopathy. Does not bruise/bleed easily.  Psychiatric/Behavioral: Negative for confusion.      Blood pressure 144/88, pulse 76, temperature 98.4 F (36.9 C), temperature source Oral, resp. rate 16, height 5\' 3"  (1.6 m), weight 259 lb 9.6 oz (117.754 kg).   Physical Exam   Constitutional: She is oriented to person, place, and time. She appears well-developed and well-nourished. No distress.  BMI 45.99  HENT:   Head: Normocephalic and atraumatic.   Nose: Nose normal.   Mouth/Throat: No oropharyngeal exudate.  Eyes: Conjunctivae and EOM are normal. Pupils are equal, round, and reactive to light. Left eye exhibits no discharge. No scleral icterus.  Neck: Neck supple. No JVD present. No tracheal deviation present. No thyromegaly present.  Cardiovascular: Normal rate, regular rhythm, normal heart sounds and intact distal pulses.    No murmur heard. Pulmonary/Chest: Effort normal and breath sounds normal. No respiratory distress. She has no wheezes. She has no rales. She exhibits no tenderness.  Abdominal: Soft. Bowel sounds are normal. She exhibits no distension and no mass. There is no tenderness. There is no rebound and no guarding.  Right costal margin is tender to palpation, suggesting costochondritis. Abdomen is generally soft and there is no mass or peritoneal inflammation in the right upper quadrant, although subjectively she says it hurts a little bit to palpate deeply. No guarding or rebound.  Musculoskeletal: She exhibits no edema and no tenderness.   Lymphadenopathy:    She has no cervical adenopathy.  Neurological: She is alert and oriented to person, place, and time. She exhibits normal muscle tone. Coordination normal.  Skin: Skin is warm. No rash noted. She is not diaphoretic. No erythema. No pallor.  Psychiatric: She has a normal mood and affect. Her behavior is normal. Judgment and thought content normal.      Data Reviewed Ultrasound report. Office notes from PCP. Lab work requested.   Assessment    Chronic cholecystitis with cholelithiasis. It sounds like she is having accelerating biliary colic and may be resolving an attack of acute cholecystitis.   Suspect costochondritis, right costal margin   Morbid obesity, BMI 45.99   GERD   Hypertension   Hyperlipidemia      Plan    I discussed all of her diagnoses with her. I told her that it was advisable to proceed with a lower operation in the near future. I told her that her pain from her costochondritis might persist, however. She would like to go ahead with gallbladder surgery   She'll be scheduled for laparoscopic cholecystectomy with cholangiogram, possible open.   Discussed indications, details, techniques, and numerous risks of the surgery with her and her husband. She is aware of the risk of bleeding, infection, conversion to open laparotomy, I'll leak, injury to adjacent organs with major reconstructive surgery, wound hernia, pancreatitis, diarrhea, and other unforeseen problems. She understands these issues well. At this time all of her questions were answered. She is in agreement with this plan.   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 I told her it  be reasonable to do that and have a gallbladder operation afterwards. Low-fat diet stressed.           Angelia Mould. Derrell Lolling, M.D., [facility] General and Minimally invasive Surgery Breast and Colorectal Surgery Office:    [phone] Pager:   [phone]

## 2014-01-19 ENCOUNTER — Encounter (HOSPITAL_COMMUNITY): Payer: Self-pay | Admitting: *Deleted

## 2014-01-19 ENCOUNTER — Encounter (HOSPITAL_COMMUNITY): Payer: Self-pay | Admitting: Pharmacist

## 2014-01-19 MED ORDER — CEFAZOLIN SODIUM 10 G IJ SOLR
3.0000 g | INTRAMUSCULAR | Status: AC
  Administered 2014-01-20: 3 g via INTRAVENOUS
  Filled 2014-01-19 (×2): qty 3000

## 2014-01-19 MED ORDER — CHLORHEXIDINE GLUCONATE 4 % EX LIQD
1.0000 "application " | Freq: Once | CUTANEOUS | Status: DC
  Filled 2014-01-19: qty 15

## 2014-01-19 NOTE — Progress Notes (Signed)
01/19/14 1210  OBSTRUCTIVE SLEEP APNEA  Have you ever been diagnosed with sleep apnea through a sleep study? No  Do you snore loudly (loud enough to be heard through closed doors)?  0  Do you often feel tired, fatigued, or sleepy during the daytime? 1  Has anyone observed you stop breathing during your sleep? 0  Do you have, or are you being treated for high blood pressure? 1  BMI more than 35 kg/m2? 1  Age over 358 years old? 1  Gender: 0  Obstructive Sleep Apnea Score 4  Score 4 or greater  Results sent to PCP

## 2014-01-20 ENCOUNTER — Ambulatory Visit (HOSPITAL_COMMUNITY): Payer: 59 | Admitting: Anesthesiology

## 2014-01-20 ENCOUNTER — Ambulatory Visit (HOSPITAL_COMMUNITY): Payer: 59

## 2014-01-20 ENCOUNTER — Encounter (HOSPITAL_COMMUNITY): Payer: 59 | Admitting: Anesthesiology

## 2014-01-20 ENCOUNTER — Inpatient Hospital Stay (HOSPITAL_COMMUNITY)
Admission: RE | Admit: 2014-01-20 | Discharge: 2014-01-22 | DRG: 418 | Disposition: A | Discharge status: Home / Self Care | Payer: 59 | Source: Ambulatory Visit | Attending: General Surgery | Admitting: General Surgery

## 2014-01-20 ENCOUNTER — Encounter (HOSPITAL_COMMUNITY): Payer: Self-pay | Admitting: Certified Registered Nurse Anesthetist

## 2014-01-20 ENCOUNTER — Encounter (HOSPITAL_COMMUNITY)
Admission: RE | Disposition: A | Discharge status: Home / Self Care | Payer: Self-pay | Source: Ambulatory Visit | Attending: General Surgery

## 2014-01-20 DIAGNOSIS — K8064 Calculus of gallbladder and bile duct with chronic cholecystitis without obstruction: Principal | ICD-10-CM | POA: Diagnosis present

## 2014-01-20 DIAGNOSIS — K8060 Calculus of gallbladder and bile duct with cholecystitis, unspecified, without obstruction: Principal | ICD-10-CM | POA: Diagnosis present

## 2014-01-20 DIAGNOSIS — I2510 Atherosclerotic heart disease of native coronary artery without angina pectoris: Secondary | ICD-10-CM | POA: Diagnosis present

## 2014-01-20 DIAGNOSIS — I10 Essential (primary) hypertension: Secondary | ICD-10-CM | POA: Diagnosis present

## 2014-01-20 DIAGNOSIS — Z8249 Family history of ischemic heart disease and other diseases of the circulatory system: Secondary | ICD-10-CM

## 2014-01-20 DIAGNOSIS — Z87891 Personal history of nicotine dependence: Secondary | ICD-10-CM

## 2014-01-20 DIAGNOSIS — K219 Gastro-esophageal reflux disease without esophagitis: Secondary | ICD-10-CM | POA: Diagnosis present

## 2014-01-20 DIAGNOSIS — K8070 Calculus of gallbladder and bile duct without cholecystitis without obstruction: Secondary | ICD-10-CM | POA: Diagnosis present

## 2014-01-20 DIAGNOSIS — Z6841 Body Mass Index (BMI) 40.0 and over, adult: Secondary | ICD-10-CM

## 2014-01-20 DIAGNOSIS — Z7982 Long term (current) use of aspirin: Secondary | ICD-10-CM

## 2014-01-20 DIAGNOSIS — K8020 Calculus of gallbladder without cholecystitis without obstruction: Secondary | ICD-10-CM | POA: Diagnosis present

## 2014-01-20 DIAGNOSIS — K429 Umbilical hernia without obstruction or gangrene: Secondary | ICD-10-CM | POA: Diagnosis present

## 2014-01-20 DIAGNOSIS — E785 Hyperlipidemia, unspecified: Secondary | ICD-10-CM | POA: Diagnosis present

## 2014-01-20 DIAGNOSIS — F411 Generalized anxiety disorder: Secondary | ICD-10-CM | POA: Diagnosis present

## 2014-01-20 DIAGNOSIS — K8010 Calculus of gallbladder with chronic cholecystitis without obstruction: Secondary | ICD-10-CM

## 2014-01-20 DIAGNOSIS — Z825 Family history of asthma and other chronic lower respiratory diseases: Secondary | ICD-10-CM

## 2014-01-20 HISTORY — DX: Unspecified osteoarthritis, unspecified site: M19.90

## 2014-01-20 HISTORY — PX: CHOLECYSTECTOMY: SHX55

## 2014-01-20 LAB — CBC WITH DIFFERENTIAL/PLATELET
BASOS ABS: 0 10*3/uL (ref 0.0–0.1)
Basophils Relative: 0 % (ref 0–1)
EOS PCT: 3 % (ref 0–5)
Eosinophils Absolute: 0.2 10*3/uL (ref 0.0–0.7)
HEMATOCRIT: 39.8 % (ref 36.0–46.0)
HEMOGLOBIN: 13.3 g/dL (ref 12.0–15.0)
Lymphocytes Relative: 31 % (ref 12–46)
Lymphs Abs: 2.4 10*3/uL (ref 0.7–4.0)
MCH: 29.2 pg (ref 26.0–34.0)
MCHC: 33.4 g/dL (ref 30.0–36.0)
MCV: 87.5 fL (ref 78.0–100.0)
MONO ABS: 0.8 10*3/uL (ref 0.1–1.0)
MONOS PCT: 11 % (ref 3–12)
NEUTROS ABS: 4.1 10*3/uL (ref 1.7–7.7)
Neutrophils Relative %: 55 % (ref 43–77)
Platelets: 299 10*3/uL (ref 150–400)
RBC: 4.55 MIL/uL (ref 3.87–5.11)
RDW: 14.8 % (ref 11.5–15.5)
WBC: 7.5 10*3/uL (ref 4.0–10.5)

## 2014-01-20 LAB — COMPREHENSIVE METABOLIC PANEL
ALT: 31 U/L (ref 0–35)
AST: 28 U/L (ref 0–37)
Albumin: 3.6 g/dL (ref 3.5–5.2)
Alkaline Phosphatase: 84 U/L (ref 39–117)
BILIRUBIN TOTAL: 0.5 mg/dL (ref 0.3–1.2)
BUN: 20 mg/dL (ref 6–23)
CALCIUM: 9.6 mg/dL (ref 8.4–10.5)
CHLORIDE: 104 meq/L (ref 96–112)
CO2: 26 mEq/L (ref 19–32)
CREATININE: 1.06 mg/dL (ref 0.50–1.10)
GFR calc Af Amer: 64 mL/min — ABNORMAL LOW (ref >90)
GFR, EST NON AFRICAN AMERICAN: 55 mL/min — AB (ref >90)
Glucose, Bld: 99 mg/dL (ref 70–99)
Potassium: 4.5 mEq/L (ref 3.7–5.3)
Sodium: 144 mEq/L (ref 137–147)
Total Protein: 7.1 g/dL (ref 6.0–8.3)

## 2014-01-20 LAB — LIPASE, BLOOD: LIPASE: 23 U/L (ref 11–59)

## 2014-01-20 IMAGING — RF DG CHOLANGIOGRAM OPERATIVE
1 series · 4 of 4 positions shown · non-contrast
Comparison: None.

CLINICAL DATA: Cholelithiasis

EXAM:
INTRAOPERATIVE CHOLANGIOGRAM
TECHNIQUE: Cholangiographic images from the C-arm fluoroscopic device were
submitted for interpretation post-operatively. Please see the
procedural report for the amount of contrast and the fluoroscopy
time utilized.

[Series 1: run · 4 of 78 frames shown]
[frame 6/78]
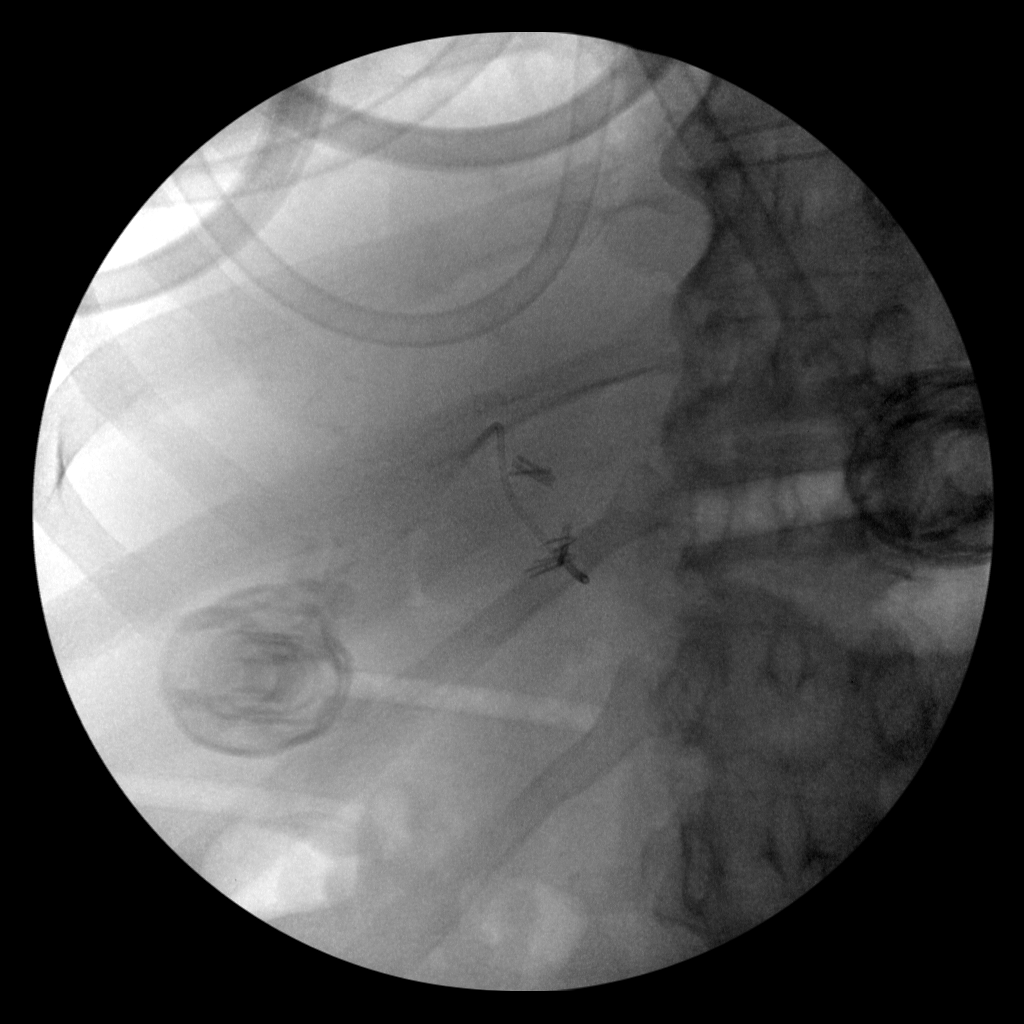
[frame 12/78]
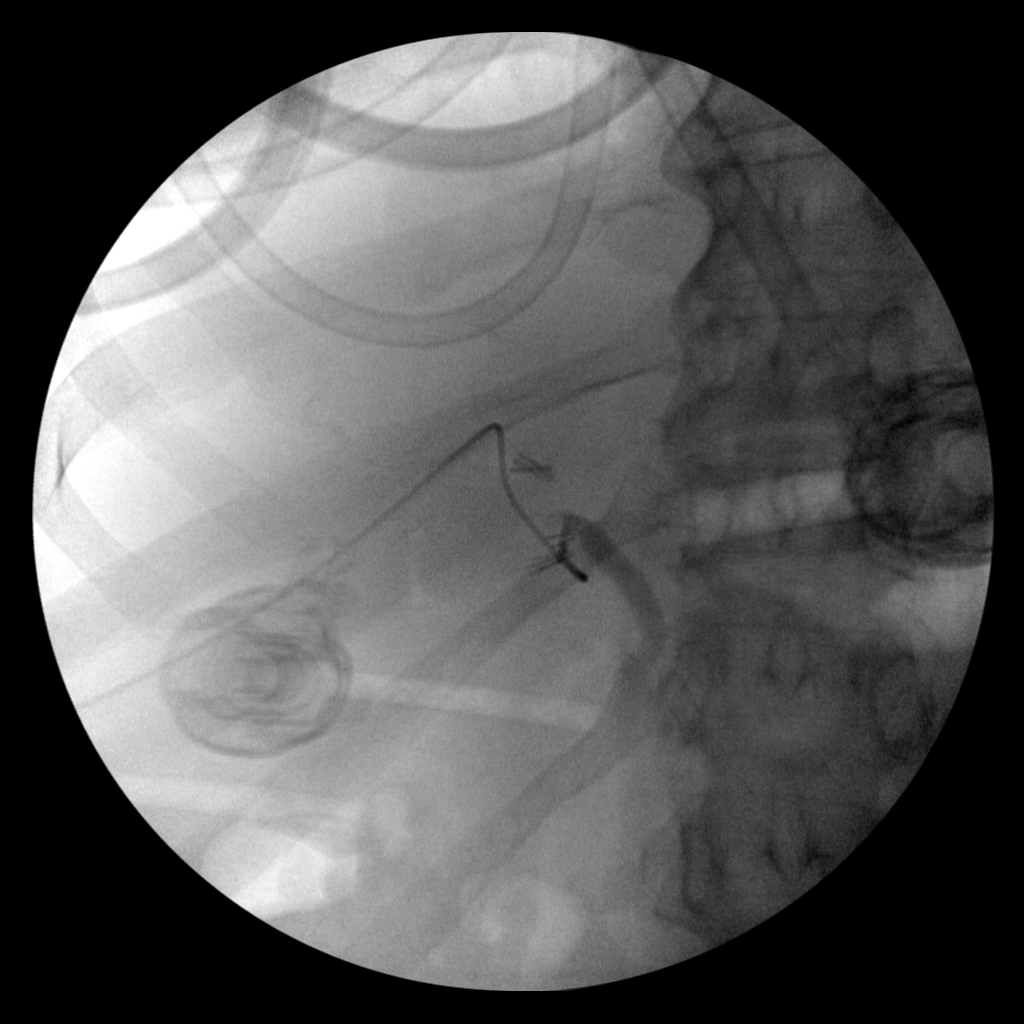
[frame 40/78]
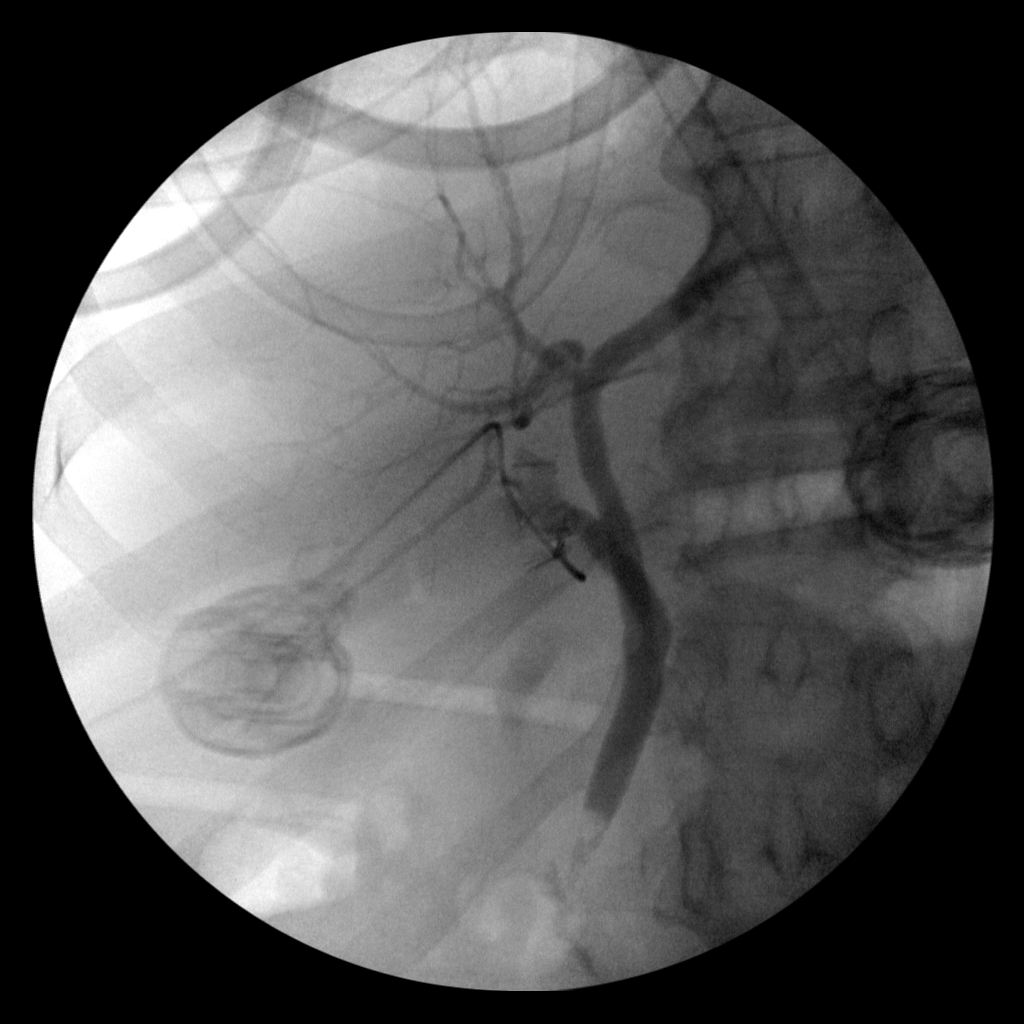
[frame 67/78]
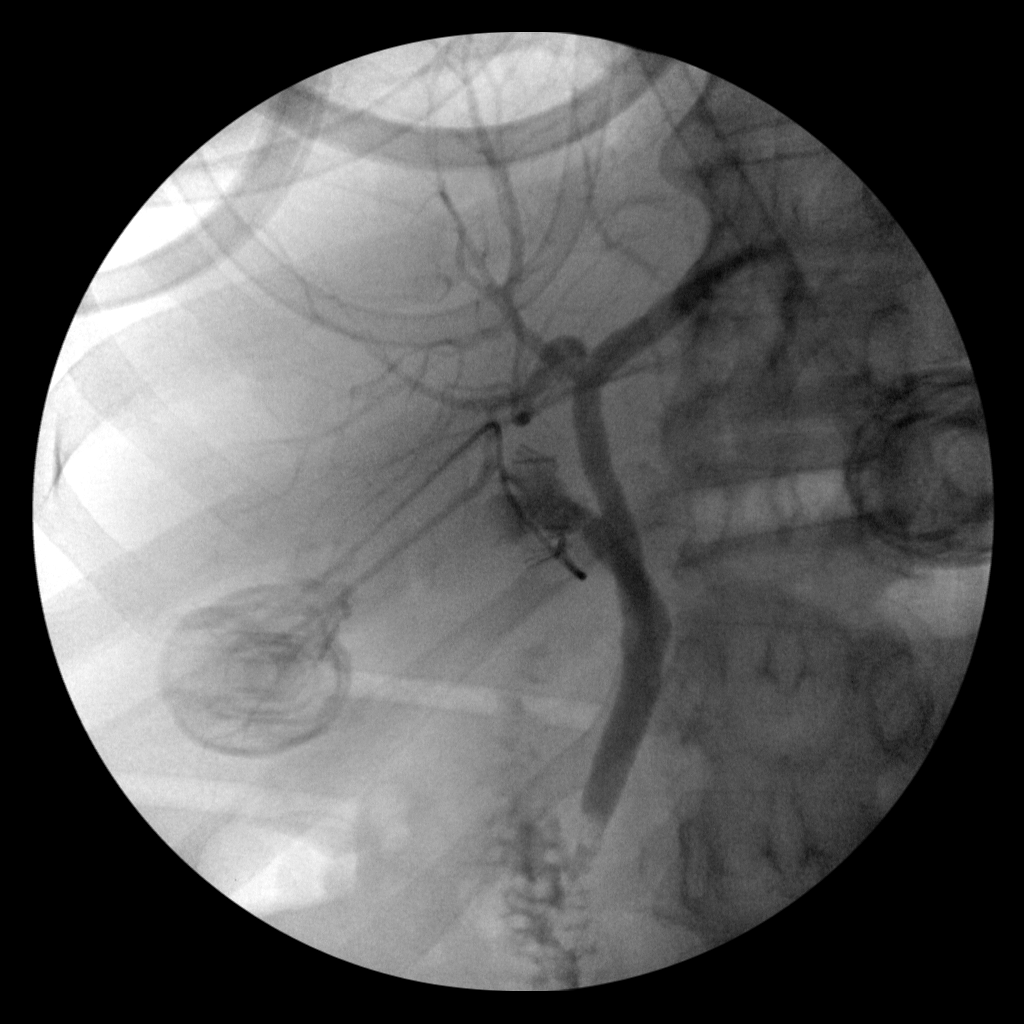

[4 of 4 positions shown; findings below may reference images not displayed]

FINDINGS: Persistent partially occlusive filling defect in the distal common
bile duct just proximal to the ampulla. The visualized central
intrahepatic ducts are decompressed, unremarkable. Contrast flows
into the duodenum.
IMPRESSION: 1. Partially occlusive distal CBD filling defect suggesting retained
stone, clot, or air bubble.

## 2014-01-20 SURGERY — LAPAROSCOPIC CHOLECYSTECTOMY WITH INTRAOPERATIVE CHOLANGIOGRAM
Anesthesia: General

## 2014-01-20 MED ORDER — FENTANYL CITRATE 0.05 MG/ML IJ SOLN
12.5000 ug | INTRAMUSCULAR | Status: DC | PRN
  Administered 2014-01-20 – 2014-01-21 (×2): 25 ug via INTRAVENOUS
  Filled 2014-01-20 (×2): qty 2

## 2014-01-20 MED ORDER — EPHEDRINE SULFATE 50 MG/ML IJ SOLN
INTRAMUSCULAR | Status: AC
  Filled 2014-01-20: qty 1

## 2014-01-20 MED ORDER — PANTOPRAZOLE SODIUM 40 MG IV SOLR
40.0000 mg | Freq: Two times a day (BID) | INTRAVENOUS | Status: DC
  Administered 2014-01-20 – 2014-01-21 (×3): 40 mg via INTRAVENOUS
  Filled 2014-01-20 (×5): qty 40

## 2014-01-20 MED ORDER — LACTATED RINGERS IV SOLN
INTRAVENOUS | Status: DC
  Administered 2014-01-20: 12:00:00 via INTRAVENOUS

## 2014-01-20 MED ORDER — PHENYLEPHRINE 40 MCG/ML (10ML) SYRINGE FOR IV PUSH (FOR BLOOD PRESSURE SUPPORT)
PREFILLED_SYRINGE | INTRAVENOUS | Status: AC
  Filled 2014-01-20: qty 10

## 2014-01-20 MED ORDER — DEXAMETHASONE SODIUM PHOSPHATE 4 MG/ML IJ SOLN
INTRAMUSCULAR | Status: AC
  Filled 2014-01-20: qty 2

## 2014-01-20 MED ORDER — SODIUM CHLORIDE 0.9 % IR SOLN
Status: DC | PRN
  Administered 2014-01-20: 1000 mL

## 2014-01-20 MED ORDER — FENTANYL CITRATE 0.05 MG/ML IJ SOLN
INTRAMUSCULAR | Status: DC | PRN
  Administered 2014-01-20: 50 ug via INTRAVENOUS
  Administered 2014-01-20: 100 ug via INTRAVENOUS

## 2014-01-20 MED ORDER — MIDAZOLAM HCL 5 MG/5ML IJ SOLN
INTRAMUSCULAR | Status: DC | PRN
  Administered 2014-01-20: 2 mg via INTRAVENOUS

## 2014-01-20 MED ORDER — NEOSTIGMINE METHYLSULFATE 10 MG/10ML IV SOLN
INTRAVENOUS | Status: DC | PRN
  Administered 2014-01-20: 4 mg via INTRAVENOUS

## 2014-01-20 MED ORDER — PROPOFOL 10 MG/ML IV BOLUS
INTRAVENOUS | Status: AC
  Filled 2014-01-20: qty 20

## 2014-01-20 MED ORDER — POTASSIUM CHLORIDE IN NACL 20-0.9 MEQ/L-% IV SOLN
INTRAVENOUS | Status: DC
  Administered 2014-01-20 – 2014-01-21 (×3): via INTRAVENOUS
  Filled 2014-01-20 (×8): qty 1000

## 2014-01-20 MED ORDER — SODIUM CHLORIDE 0.9 % IV SOLN
INTRAVENOUS | Status: DC | PRN
  Administered 2014-01-20: 14:00:00

## 2014-01-20 MED ORDER — PROMETHAZINE HCL 25 MG/ML IJ SOLN: 6.2500 mg | INTRAMUSCULAR | Status: DC | PRN

## 2014-01-20 MED ORDER — LACTATED RINGERS IV SOLN
INTRAVENOUS | Status: DC | PRN
  Administered 2014-01-20 (×2): via INTRAVENOUS

## 2014-01-20 MED ORDER — ONDANSETRON HCL 4 MG PO TABS: 4.0000 mg | ORAL_TABLET | Freq: Four times a day (QID) | ORAL | Status: DC | PRN

## 2014-01-20 MED ORDER — OXYCODONE HCL 5 MG PO TABS: 5.0000 mg | ORAL_TABLET | Freq: Once | ORAL | Status: DC | PRN

## 2014-01-20 MED ORDER — 0.9 % SODIUM CHLORIDE (POUR BTL) OPTIME
TOPICAL | Status: DC | PRN
  Administered 2014-01-20: 1000 mL

## 2014-01-20 MED ORDER — MIDAZOLAM HCL 2 MG/2ML IJ SOLN
INTRAMUSCULAR | Status: AC
  Filled 2014-01-20: qty 2

## 2014-01-20 MED ORDER — ROCURONIUM BROMIDE 50 MG/5ML IV SOLN
INTRAVENOUS | Status: AC
  Filled 2014-01-20: qty 1

## 2014-01-20 MED ORDER — BUPIVACAINE-EPINEPHRINE 0.5% -1:200000 IJ SOLN
INTRAMUSCULAR | Status: DC | PRN
  Administered 2014-01-20: 20 mL

## 2014-01-20 MED ORDER — DEXAMETHASONE SODIUM PHOSPHATE 4 MG/ML IJ SOLN
INTRAMUSCULAR | Status: DC | PRN
  Administered 2014-01-20: 8 mg via INTRAVENOUS

## 2014-01-20 MED ORDER — GLYCOPYRROLATE 0.2 MG/ML IJ SOLN
INTRAMUSCULAR | Status: AC
  Filled 2014-01-20: qty 3

## 2014-01-20 MED ORDER — LIDOCAINE HCL (CARDIAC) 20 MG/ML IV SOLN
INTRAVENOUS | Status: AC
  Filled 2014-01-20: qty 5

## 2014-01-20 MED ORDER — ONDANSETRON HCL 4 MG/2ML IJ SOLN
INTRAMUSCULAR | Status: AC
  Filled 2014-01-20: qty 2

## 2014-01-20 MED ORDER — HYDROMORPHONE HCL PF 1 MG/ML IJ SOLN
0.2500 mg | INTRAMUSCULAR | Status: DC | PRN
  Administered 2014-01-20: 0.5 mg via INTRAVENOUS

## 2014-01-20 MED ORDER — CHLORHEXIDINE GLUCONATE 0.12 % MT SOLN
15.0000 mL | Freq: Two times a day (BID) | OROMUCOSAL | Status: DC
  Administered 2014-01-20 – 2014-01-21 (×2): 15 mL via OROMUCOSAL
  Filled 2014-01-20 (×2): qty 15

## 2014-01-20 MED ORDER — HYDROCHLOROTHIAZIDE 25 MG PO TABS
25.0000 mg | ORAL_TABLET | Freq: Every day | ORAL | Status: DC
  Filled 2014-01-20 (×2): qty 1

## 2014-01-20 MED ORDER — LIDOCAINE HCL (CARDIAC) 20 MG/ML IV SOLN
INTRAVENOUS | Status: DC | PRN
  Administered 2014-01-20: 60 mg via INTRAVENOUS

## 2014-01-20 MED ORDER — ONDANSETRON HCL 4 MG/2ML IJ SOLN
INTRAMUSCULAR | Status: DC | PRN
  Administered 2014-01-20: 4 mg via INTRAVENOUS

## 2014-01-20 MED ORDER — ONDANSETRON HCL 4 MG/2ML IJ SOLN: 4.0000 mg | Freq: Four times a day (QID) | INTRAMUSCULAR | Status: DC | PRN

## 2014-01-20 MED ORDER — PROPOFOL 10 MG/ML IV BOLUS
INTRAVENOUS | Status: DC | PRN
  Administered 2014-01-20: 150 mg via INTRAVENOUS

## 2014-01-20 MED ORDER — WHITE PETROLATUM GEL
Status: AC
  Administered 2014-01-20: 0.2
  Filled 2014-01-20: qty 5

## 2014-01-20 MED ORDER — IRBESARTAN 300 MG PO TABS
300.0000 mg | ORAL_TABLET | Freq: Every day | ORAL | Status: DC
  Filled 2014-01-20 (×2): qty 1

## 2014-01-20 MED ORDER — GLYCOPYRROLATE 0.2 MG/ML IJ SOLN
INTRAMUSCULAR | Status: DC | PRN
  Administered 2014-01-20: 0.6 mg via INTRAVENOUS

## 2014-01-20 MED ORDER — FENTANYL CITRATE 0.05 MG/ML IJ SOLN
INTRAMUSCULAR | Status: AC
  Filled 2014-01-20: qty 5

## 2014-01-20 MED ORDER — DEXTROSE 5 % IV SOLN
3.0000 g | Freq: Three times a day (TID) | INTRAVENOUS | Status: DC
  Administered 2014-01-20 – 2014-01-22 (×4): 3 g via INTRAVENOUS
  Filled 2014-01-20 (×7): qty 3000

## 2014-01-20 MED ORDER — PHENYLEPHRINE HCL 10 MG/ML IJ SOLN
INTRAMUSCULAR | Status: DC | PRN
  Administered 2014-01-20: 80 ug via INTRAVENOUS

## 2014-01-20 MED ORDER — ROCURONIUM BROMIDE 100 MG/10ML IV SOLN
INTRAVENOUS | Status: DC | PRN
  Administered 2014-01-20: 40 mg via INTRAVENOUS

## 2014-01-20 MED ORDER — NEOSTIGMINE METHYLSULFATE 10 MG/10ML IV SOLN
INTRAVENOUS | Status: AC
  Filled 2014-01-20: qty 1

## 2014-01-20 MED ORDER — OXYCODONE HCL 5 MG/5ML PO SOLN: 5.0000 mg | Freq: Once | ORAL | Status: DC | PRN

## 2014-01-20 MED ORDER — BIOTENE DRY MOUTH MT LIQD
15.0000 mL | Freq: Two times a day (BID) | OROMUCOSAL | Status: DC
  Administered 2014-01-21: 15 mL via OROMUCOSAL

## 2014-01-20 MED ORDER — HYDROMORPHONE HCL PF 1 MG/ML IJ SOLN
INTRAMUSCULAR | Status: AC
  Administered 2014-01-20: 0.5 mg via INTRAVENOUS
  Filled 2014-01-20: qty 1

## 2014-01-20 MED ORDER — STERILE WATER FOR INJECTION IJ SOLN
INTRAMUSCULAR | Status: AC
  Filled 2014-01-20: qty 10

## 2014-01-20 MED ORDER — TELMISARTAN-HCTZ 80-25 MG PO TABS: 1.0000 | ORAL_TABLET | Freq: Every morning | ORAL | Status: DC

## 2014-01-20 SURGICAL SUPPLY — 53 items
ADH SKN CLS APL DERMABOND .7 (GAUZE/BANDAGES/DRESSINGS) ×1
APPLIER CLIP ROT 10 11.4 M/L (STAPLE) ×3
APR CLP MED LRG 11.4X10 (STAPLE) ×1
BAG SPEC RTRVL LRG 6X4 10 (ENDOMECHANICALS) ×1
BLADE SURG ROTATE 9660 (MISCELLANEOUS) IMPLANT
CANISTER SUCTION 2500CC (MISCELLANEOUS) ×3 IMPLANT
CHLORAPREP W/TINT 26ML (MISCELLANEOUS) ×3 IMPLANT
CLIP APPLIE ROT 10 11.4 M/L (STAPLE) ×1 IMPLANT
COVER MAYO STAND STRL (DRAPES) ×3 IMPLANT
COVER SURGICAL LIGHT HANDLE (MISCELLANEOUS) ×3 IMPLANT
DECANTER SPIKE VIAL GLASS SM (MISCELLANEOUS) ×6 IMPLANT
DERMABOND ADVANCED (GAUZE/BANDAGES/DRESSINGS) ×2
DERMABOND ADVANCED .7 DNX12 (GAUZE/BANDAGES/DRESSINGS) ×1 IMPLANT
DRAPE C-ARM 42X72 X-RAY (DRAPES) ×3 IMPLANT
DRAPE UTILITY 15X26 W/TAPE STR (DRAPE) ×6 IMPLANT
ELECT REM PT RETURN 9FT ADLT (ELECTROSURGICAL) ×3
ELECTRODE REM PT RTRN 9FT ADLT (ELECTROSURGICAL) ×1 IMPLANT
ENDOLOOP SUT PDS II  0 18 (SUTURE) ×4
ENDOLOOP SUT PDS II 0 18 (SUTURE) IMPLANT
GLOVE BIO SURGEON STRL SZ7.5 (GLOVE) ×2 IMPLANT
GLOVE BIOGEL PI IND STRL 7.5 (GLOVE) IMPLANT
GLOVE BIOGEL PI INDICATOR 7.5 (GLOVE) ×2
GLOVE ECLIPSE 7.0 STRL STRAW (GLOVE) ×4 IMPLANT
GLOVE ECLIPSE 7.5 STRL STRAW (GLOVE) ×2 IMPLANT
GLOVE ECLIPSE 8.0 STRL XLNG CF (GLOVE) ×2 IMPLANT
GLOVE EUDERMIC 7 POWDERFREE (GLOVE) ×3 IMPLANT
GLOVE SURG SIGNA 7.5 PF LTX (GLOVE) ×2 IMPLANT
GLOVE SURG SS PI 6.5 STRL IVOR (GLOVE) ×2 IMPLANT
GLOVE SURG SS PI 7.0 STRL IVOR (GLOVE) ×2 IMPLANT
GOWN L4 LG 24 PK N/S (GOWN DISPOSABLE) ×2 IMPLANT
GOWN STRL REUS W/ TWL LRG LVL3 (GOWN DISPOSABLE) ×3 IMPLANT
GOWN STRL REUS W/ TWL XL LVL3 (GOWN DISPOSABLE) ×1 IMPLANT
GOWN STRL REUS W/TWL 2XL LVL3 (GOWN DISPOSABLE) ×2 IMPLANT
GOWN STRL REUS W/TWL LRG LVL3 (GOWN DISPOSABLE) ×9
GOWN STRL REUS W/TWL XL LVL3 (GOWN DISPOSABLE) ×6
KIT BASIN OR (CUSTOM PROCEDURE TRAY) ×3 IMPLANT
KIT ROOM TURNOVER OR (KITS) ×3 IMPLANT
NS IRRIG 1000ML POUR BTL (IV SOLUTION) ×3 IMPLANT
PAD ARMBOARD 7.5X6 YLW CONV (MISCELLANEOUS) ×3 IMPLANT
POUCH SPECIMEN RETRIEVAL 10MM (ENDOMECHANICALS) ×3 IMPLANT
SCISSORS LAP 5X35 DISP (ENDOMECHANICALS) ×3 IMPLANT
SET CHOLANGIOGRAPH 5 50 .035 (SET/KITS/TRAYS/PACK) ×3 IMPLANT
SET IRRIG TUBING LAPAROSCOPIC (IRRIGATION / IRRIGATOR) ×3 IMPLANT
SLEEVE ENDOPATH XCEL 5M (ENDOMECHANICALS) ×3 IMPLANT
SPECIMEN JAR SMALL (MISCELLANEOUS) ×3 IMPLANT
SUT MNCRL AB 4-0 PS2 18 (SUTURE) ×3 IMPLANT
SUT VICRYL 0 UR6 27IN ABS (SUTURE) ×2 IMPLANT
TOWEL OR 17X24 6PK STRL BLUE (TOWEL DISPOSABLE) ×3 IMPLANT
TOWEL OR 17X26 10 PK STRL BLUE (TOWEL DISPOSABLE) ×3 IMPLANT
TRAY LAPAROSCOPIC (CUSTOM PROCEDURE TRAY) ×3 IMPLANT
TROCAR XCEL BLUNT TIP 100MML (ENDOMECHANICALS) ×3 IMPLANT
TROCAR XCEL NON-BLD 11X100MML (ENDOMECHANICALS) ×3 IMPLANT
TROCAR XCEL NON-BLD 5MMX100MML (ENDOMECHANICALS) ×3 IMPLANT

## 2014-01-20 NOTE — Anesthesia Postprocedure Evaluation (Signed)
  Anesthesia Post-op Note  Patient: Kim Valdez  Procedure(s) Performed: Procedure(s): LAPAROSCOPIC CHOLECYSTECTOMY WITH INTRAOPERATIVE CHOLANGIOGRAM POSSIBLE OPEN (N/A)  Patient Location: PACU  Anesthesia Type:General  Level of Consciousness: awake, alert , oriented and patient cooperative  Airway and Oxygen Therapy: Patient Spontanous Breathing  Post-op Pain: mild  Post-op Assessment: Post-op Vital signs reviewed, Patient's Cardiovascular Status Stable, Respiratory Function Stable, Patent Airway, No signs of Nausea or vomiting and Pain level controlled  Post-op Vital Signs: stable  Last Vitals:  Filed Vitals:   01/20/14 1600  BP: 139/66  Pulse: 80  Temp: 36.5 C  Resp: 19    Complications: No apparent anesthesia complications

## 2014-01-20 NOTE — Interval H&P Note (Signed)
History and Physical Interval Note:  01/20/2014 12:30 PM  Kim Valdez  has presented today for surgery, with the diagnosis of gallstones  The goals and the various methods of treatment have been discussed with the patient and family. After consideration of risks, benefits and other options for treatment, the patient has consented to  Procedure(s): LAPAROSCOPIC CHOLECYSTECTOMY WITH INTRAOPERATIVE CHOLANGIOGRAM POSSIBLE OPEN (N/A) as a surgical intervention .  The patient's history has been reviewed, patient examined, no change in status, stable for surgery.  I have reviewed the patient's chart and labs.  Questions were answered to the patient's satisfaction.     Ernestene MentionHaywood M Hektor Huston

## 2014-01-20 NOTE — Transfer of Care (Signed)
Immediate Anesthesia Transfer of Care Note  Patient: Kim Valdez  Procedure(s) Performed: Procedure(s): LAPAROSCOPIC CHOLECYSTECTOMY WITH INTRAOPERATIVE CHOLANGIOGRAM POSSIBLE OPEN (N/A)  Patient Location: PACU  Anesthesia Type:General  Level of Consciousness: awake, oriented, patient cooperative and responds to stimulation  Airway & Oxygen Therapy: Patient Spontanous Breathing and Patient connected to face mask oxygen  Post-op Assessment: Report given to PACU RN and Post -op Vital signs reviewed and stable  Post vital signs: Reviewed and stable  Complications: No apparent anesthesia complications

## 2014-01-20 NOTE — Op Note (Signed)
Patient Name:           Kim Valdez   Date of Surgery:        01/20/2014  Note: This dictation was prepared with Dragon/digital dictation along with [facility] technology. Any transcriptional errors that result from this process are unintentional.   Pre op Diagnosis:    Chronic cholecystitis with cholelithiasis    Post op Diagnosis:    Chronic cholecystitis with cholelithiasis, choledocholithiasis,Umbilical hernia  Procedure:                 Laparoscopic cholecystectomy with cholangiogram  Surgeon:                     Angelia MouldHaywood M. Derrell LollingIngram, M.D., FACS  Assistant:                      RNFA  Operative Indications:   Kim Valdez is a 63 y.o. female. She [facility] for evaluation of symptomatic gallstones.  This patient has a one-year history of intermittent episodes of right upper quadrant and right back pain, right shoulder pain, nausea vomiting and diarrhea. She underwent evaluation in the emergency room one year ago but no diagnosis was made. In the interim she'll have episodes off and on, often but not always postprandial. One week ago she developed another mildly severe attack of this pain and with nausea and vomiting after eating a heavy meal at church. Ultrasound report shows gallstones and gallbladder wall thickening at 6 mm, positive sonographic Murphy sign, CBD measuring 6 mm. No other abnormalities were listed. She had lab work drawn yesterday, we called, and results are not completed yet. She says she's doing okay today. Ate Chili recently without problems, still has some right flank pain. She says she has chronic right flank pain and has seen a chiropractor in the past.  No history of liver disease cardiovascular disease other than hypertension, abdominal surgery, or urologic problems.  Comorbidities include a hyperlipidemia, hypertension, GERD, and obesity.Liver function tests were normal preoperatively. She is brought to the  operating room electively   Operative Findings:       The gallbladder was very thickwalled and chronically inflamed. There were stones and sludge in the cystic duct which was also very thickened. I was able to do a cholangiogram which showed normal intrahepatic and extrahepatic biliary anatomy, no obstruction with flow contrast into the duodenum, but she appeared to have a solitary distal common bile duct stone. She had a small umbilical hernia which we repaired at the end of the case.The liver, stomach, duodenum, small intestine, and large intestine were grossly normal to inspection otherwise.  Procedure in Detail:          Following the induction of general endotracheal anesthesia the patient's abdomen was prepped and draped in a sterile fashion. Surgical time out was performed. Intravenous antibiotics were given. 0.5% Marcaine with epinephrine was used as a local infiltration anesthetic. A vertical incision was made at the lower rim of the umbilicus. We found a small umbilical hernia and debrided some of the preperitoneal fat off of this. We inserted the [facility] trocar through this and placed a pursestring suture of 0 Vicryl. Pneumoperitoneum was created. The camera was inserted. 11 mm trocar was placed at the subxiphoid region two 5 mm trocars  Were placed in the right upper quadrant. We elevated the fundus of the gallbladder. The peritoneum was chronically thickened down  at the neck of the gallbladder and we slowly took this down. We slowly isolated the cystic artery as it went on the wall the gallbladder, secured it with metal clips and divided. We created a large window behind the infundibulum and cystic duct to be sure of our anatomy. We then made an incision into the cystic duct and milked several stones out of that . We then inserted a cholangiogram catheter and performed a cholangiogram using the C-arm. Cholangiogram showed a distal common bile duct stone but otherwise showed normal anatomy and no  obstruction. The cholangiocatheter was removed. The cystic duct was secured with multiple metal clips and an  Endoloop tie and divided. The gallbladder was dissected from its bed with electrocautery, placed in a specimen bag and removed. The subphrenic space and the operative field were copiously irrigated until the irrigation fluid was completely clear and we were confident that there was no bleeding or bile leak. The pneumoperitoneum was released the trocars were removed. The fascia at the umbilicus was carefully closed with interrupted sutures of 0 Vicryl. This primarily repaired the small hernia  well. The wounds were irrigated with saline. The skin incision were closed with subcuticular sutures of 4-0 Monocryl and Dermabond. The patient tolerated the procedure well taken to PACU in stable condition. EBL 20 cc. Counts correct. Complications none. GI consultation is planned postoperatively for consideration of ERCP.     Angelia MouldHaywood M. Derrell LollingIngram, M.D., FACS General and Minimally Invasive Surgery Breast and Colorectal Surgery  01/20/2014 2:23 PM

## 2014-01-20 NOTE — Anesthesia Preprocedure Evaluation (Addendum)
Anesthesia Evaluation  Patient identified by MRN, date of birth, ID band Patient awake    Reviewed: Allergy & Precautions, H&P , NPO status , Patient's Chart, lab work & pertinent test results  Airway Mallampati: II TM Distance: >3 FB Neck ROM: Full    Dental  (+) Teeth Intact, Dental Advisory Given   Pulmonary former smoker,    Pulmonary exam normal       Cardiovascular hypertension, Pt. on medications     Neuro/Psych negative neurological ROS  negative psych ROS   GI/Hepatic Neg liver ROS, GERD-  Medicated,  Endo/Other  negative endocrine ROS  Renal/GU negative Renal ROS     Musculoskeletal   Abdominal   Peds  Hematology negative hematology ROS (+)   Anesthesia Other Findings   Reproductive/Obstetrics                          Anesthesia Physical Anesthesia Plan  ASA: III  Anesthesia Plan: General   Post-op Pain Management:    Induction: Intravenous  Airway Management Planned: Oral ETT  Additional Equipment:   Intra-op Plan:   Post-operative Plan: Extubation in OR  Informed Consent: I have reviewed the patients History and Physical, chart, labs and discussed the procedure including the risks, benefits and alternatives for the proposed anesthesia with the patient or authorized representative who has indicated his/her understanding and acceptance.   Dental advisory given  Plan Discussed with: CRNA, Anesthesiologist and Surgeon  Anesthesia Plan Comments:        Anesthesia Quick Evaluation

## 2014-01-21 ENCOUNTER — Observation Stay (HOSPITAL_COMMUNITY): Payer: 59 | Admitting: Certified Registered Nurse Anesthetist

## 2014-01-21 ENCOUNTER — Encounter (HOSPITAL_COMMUNITY): Payer: Self-pay | Admitting: *Deleted

## 2014-01-21 ENCOUNTER — Observation Stay (HOSPITAL_COMMUNITY): Payer: 59

## 2014-01-21 ENCOUNTER — Encounter (HOSPITAL_COMMUNITY): Payer: 59 | Admitting: Certified Registered Nurse Anesthetist

## 2014-01-21 ENCOUNTER — Encounter (HOSPITAL_COMMUNITY)
Admission: RE | Disposition: A | Discharge status: Home / Self Care | Payer: Self-pay | Source: Ambulatory Visit | Attending: General Surgery

## 2014-01-21 HISTORY — PX: ERCP: SHX5425

## 2014-01-21 LAB — COMPREHENSIVE METABOLIC PANEL
ALT: 84 U/L — ABNORMAL HIGH (ref 0–35)
AST: 84 U/L — ABNORMAL HIGH (ref 0–37)
Albumin: 3.1 g/dL — ABNORMAL LOW (ref 3.5–5.2)
Alkaline Phosphatase: 88 U/L (ref 39–117)
BUN: 14 mg/dL (ref 6–23)
CALCIUM: 8.9 mg/dL (ref 8.4–10.5)
CHLORIDE: 104 meq/L (ref 96–112)
CO2: 25 mEq/L (ref 19–32)
CREATININE: 0.9 mg/dL (ref 0.50–1.10)
GFR calc Af Amer: 78 mL/min — ABNORMAL LOW (ref >90)
GFR, EST NON AFRICAN AMERICAN: 67 mL/min — AB (ref >90)
Glucose, Bld: 113 mg/dL — ABNORMAL HIGH (ref 70–99)
Potassium: 4.7 mEq/L (ref 3.7–5.3)
Sodium: 141 mEq/L (ref 137–147)
Total Bilirubin: 0.4 mg/dL (ref 0.3–1.2)
Total Protein: 6.7 g/dL (ref 6.0–8.3)

## 2014-01-21 LAB — CBC
HCT: 39 % (ref 36.0–46.0)
HEMOGLOBIN: 13 g/dL (ref 12.0–15.0)
MCH: 29.2 pg (ref 26.0–34.0)
MCHC: 33.3 g/dL (ref 30.0–36.0)
MCV: 87.6 fL (ref 78.0–100.0)
PLATELETS: 306 10*3/uL (ref 150–400)
RBC: 4.45 MIL/uL (ref 3.87–5.11)
RDW: 14.8 % (ref 11.5–15.5)
WBC: 11.9 10*3/uL — AB (ref 4.0–10.5)

## 2014-01-21 SURGERY — ERCP, WITH INTERVENTION IF INDICATED
Anesthesia: General

## 2014-01-21 MED ORDER — PROPOFOL 10 MG/ML IV BOLUS
INTRAVENOUS | Status: DC | PRN
  Administered 2014-01-21: 130 mg via INTRAVENOUS

## 2014-01-21 MED ORDER — MIDAZOLAM HCL 5 MG/5ML IJ SOLN
INTRAMUSCULAR | Status: DC | PRN
  Administered 2014-01-21: 2 mg via INTRAVENOUS

## 2014-01-21 MED ORDER — LIDOCAINE HCL (CARDIAC) 20 MG/ML IV SOLN
INTRAVENOUS | Status: DC | PRN
  Administered 2014-01-21: 40 mg via INTRAVENOUS

## 2014-01-21 MED ORDER — HYDROMORPHONE HCL PF 1 MG/ML IJ SOLN: 0.2500 mg | INTRAMUSCULAR | Status: DC | PRN

## 2014-01-21 MED ORDER — OXYCODONE HCL 5 MG PO TABS: 5.0000 mg | ORAL_TABLET | Freq: Once | ORAL | Status: DC | PRN

## 2014-01-21 MED ORDER — ROCURONIUM BROMIDE 100 MG/10ML IV SOLN
INTRAVENOUS | Status: DC | PRN
  Administered 2014-01-21: 40 mg via INTRAVENOUS

## 2014-01-21 MED ORDER — SODIUM CHLORIDE 0.9 % IV SOLN: INTRAVENOUS | Status: DC

## 2014-01-21 MED ORDER — CIPROFLOXACIN IN D5W 400 MG/200ML IV SOLN
INTRAVENOUS | Status: AC
  Filled 2014-01-21: qty 200

## 2014-01-21 MED ORDER — PHENYLEPHRINE HCL 10 MG/ML IJ SOLN
INTRAMUSCULAR | Status: DC | PRN
  Administered 2014-01-21: 40 ug via INTRAVENOUS
  Administered 2014-01-21: 80 ug via INTRAVENOUS
  Administered 2014-01-21: 40 ug via INTRAVENOUS
  Administered 2014-01-21 (×3): 80 ug via INTRAVENOUS

## 2014-01-21 MED ORDER — OXYCODONE HCL 5 MG/5ML PO SOLN: 5.0000 mg | Freq: Once | ORAL | Status: DC | PRN

## 2014-01-21 MED ORDER — LACTATED RINGERS IV SOLN
INTRAVENOUS | Status: DC | PRN
  Administered 2014-01-21: 14:00:00 via INTRAVENOUS

## 2014-01-21 MED ORDER — ARTIFICIAL TEARS OP OINT
TOPICAL_OINTMENT | OPHTHALMIC | Status: DC | PRN
  Administered 2014-01-21: 1 via OPHTHALMIC

## 2014-01-21 MED ORDER — GLYCOPYRROLATE 0.2 MG/ML IJ SOLN
INTRAMUSCULAR | Status: DC | PRN
  Administered 2014-01-21: .8 mg via INTRAVENOUS

## 2014-01-21 MED ORDER — GLUCAGON HCL (RDNA) 1 MG IJ SOLR
INTRAMUSCULAR | Status: AC
  Filled 2014-01-21: qty 1

## 2014-01-21 MED ORDER — LACTATED RINGERS IV SOLN
INTRAVENOUS | Status: DC
  Administered 2014-01-21: 1000 mL via INTRAVENOUS

## 2014-01-21 MED ORDER — EPHEDRINE SULFATE 50 MG/ML IJ SOLN
INTRAMUSCULAR | Status: DC | PRN
  Administered 2014-01-21: 5 mg via INTRAVENOUS

## 2014-01-21 MED ORDER — CIPROFLOXACIN IN D5W 400 MG/200ML IV SOLN
400.0000 mg | Freq: Once | INTRAVENOUS | Status: AC
  Administered 2014-01-21 (×2): 400 mg via INTRAVENOUS

## 2014-01-21 MED ORDER — ONDANSETRON HCL 4 MG/2ML IJ SOLN
INTRAMUSCULAR | Status: DC | PRN
  Administered 2014-01-21: 4 mg via INTRAVENOUS

## 2014-01-21 MED ORDER — SODIUM CHLORIDE 0.9 % IV SOLN
INTRAVENOUS | Status: DC | PRN
  Administered 2014-01-21: 15:00:00

## 2014-01-21 MED ORDER — NEOSTIGMINE METHYLSULFATE 10 MG/10ML IV SOLN
INTRAVENOUS | Status: DC | PRN
  Administered 2014-01-21: 5 mg via INTRAVENOUS

## 2014-01-21 MED ORDER — PHENYLEPHRINE HCL 10 MG/ML IJ SOLN
10.0000 mg | INTRAVENOUS | Status: DC | PRN
  Administered 2014-01-21: 20 ug/min via INTRAVENOUS

## 2014-01-21 MED ORDER — FENTANYL CITRATE 0.05 MG/ML IJ SOLN
INTRAMUSCULAR | Status: DC | PRN
  Administered 2014-01-21: 150 ug via INTRAVENOUS
  Administered 2014-01-21: 50 ug via INTRAVENOUS

## 2014-01-21 NOTE — Interval H&P Note (Signed)
History and Physical Interval Note:  01/21/2014 1:27 PM  Kim Valdez  has presented today for surgery, with the diagnosis of b.d. stones  The various methods of treatment have been discussed with the patient and family. After consideration of risks, benefits and other options for treatment, the patient has consented to  Procedure(s): ENDOSCOPIC RETROGRADE CHOLANGIOPANCREATOGRAPHY (ERCP) (N/A) as a surgical intervention .  The patient's history has been reviewed, patient examined, no change in status, stable for surgery.  I have reviewed the patient's chart and labs.  Questions were answered to the patient's satisfaction.     Kim Valdez  Assessment:  1.  Abnormal intraoperative cholangiogram, suspect bile duct stone.  Plan:  1.  Endoscopic retrograde cholangiopancreatography with biliary sphincterotomy and possible bile duct stone extraction. 2.  Risks (up to and including bleeding, infection, perforation, pancreatitis that can be complicated by infected necrosis and death), benefits (removal of stones, alleviating blockage, decreasing risk of cholangitis or choledocholithiasis-related pancreatitis), and alternatives (watchful waiting, percutaneous transhepatic cholangiography) of ERCP were explained to patient/family in detail and patient elects to proceed.

## 2014-01-21 NOTE — Anesthesia Procedure Notes (Signed)
Procedure Name: Intubation Date/Time: 01/21/2014 2:04 PM Performed by: Vita BarleyURNER, Eron Staat E Pre-anesthesia Checklist: Patient identified, Emergency Drugs available, Suction available and Patient being monitored Patient Re-evaluated:Patient Re-evaluated prior to inductionOxygen Delivery Method: Circle system utilized Preoxygenation: Pre-oxygenation with 100% oxygen Intubation Type: IV induction Ventilation: Mask ventilation without difficulty Laryngoscope Size: Miller and 2 Grade View: Grade I Tube type: Oral Tube size: 7.5 mm Number of attempts: 1 Airway Equipment and Method: Stylet Placement Confirmation: ETT inserted through vocal cords under direct vision,  positive ETCO2 and breath sounds checked- equal and bilateral Secured at: 22 cm Tube secured with: Tape Dental Injury: Teeth and Oropharynx as per pre-operative assessment

## 2014-01-21 NOTE — Consult Note (Signed)
[facility]ology Consultation Note  Referring Provider: Dr. Claud Kelp ([facility]) Primary Care Physician:  Verlon Au, MD  Reason for Consultation:  Abnormal intraoperative cholangiogram  HPI: Kim Valdez is a 63 y.o. female admitted post-cholecystectomy for abnormal intraoperative cholangiogram.  Patient with longstanding biliary colic symptoms, with gallstones, for which she underwent laparoscopic cholecystectomy with intraoperative cholangiogram yesterday.  Pre-operative LFTs normal.  Intraoperative cholangiogram, which I have personally reviewed, showed persistent distal filling defect most consistent with bile duct stone.  Patient has some persistent right shoulder and abdominal wall discomfort, post-operatively. Is very hungry.   Past Medical History  Diagnosis Date  . Morbid obesity   . HTN (hypertension)   . HLD (hyperlipidemia)   . GERD (gastroesophageal reflux disease)   . Arthritis     lower back    Past Surgical History  Procedure Laterality Date  . Colonoscopy    . Tonsillectomy    . Cholecystectomy  01/20/2014    DR Derrell Lolling    Prior to Admission medications   Medication Sig Start Date End Date Taking? Authorizing Provider  aspirin 81 MG tablet Take 81 mg by mouth every morning.    Yes Historical Provider, MD  esomeprazole (NEXIUM) 40 MG capsule Take 40 mg by mouth daily before breakfast.   Yes Historical Provider, MD  magnesium oxide (MAG-OX) 400 MG tablet Take 400 mg by mouth every morning.    Yes Historical Provider, MD  Multiple Minerals-Vitamins (CALCIUM CITRATE PLUS/MAGNESIUM PO) Take 1 tablet by mouth every morning.   Yes Historical Provider, MD  Multiple Vitamin (MULTIVITAMIN) tablet Take 1 tablet by mouth every morning.    Yes Historical Provider, MD  telmisartan-hydrochlorothiazide (MICARDIS HCT) 80-25 MG per tablet Take 1 tablet by mouth every morning.    Yes Historical Provider, MD    Current Facility-Administered Medications   Medication Dose Route Frequency Provider Last Rate Last Dose  . 0.9 % NaCl with KCl 20 mEq/ L  infusion   Intravenous Continuous Ernestene Mention, MD 125 mL/hr at 01/21/14 0150    . antiseptic oral rinse (BIOTENE) solution 15 mL  15 mL Mouth Rinse q12n4p Ernestene Mention, MD      . ceFAZolin (ANCEF) 3 g in dextrose 5 % 50 mL IVPB  3 g Intravenous 3 times per day Ernestene Mention, MD   3 g at 01/21/14 0525  . chlorhexidine (PERIDEX) 0.12 % solution 15 mL  15 mL Mouth Rinse BID Ernestene Mention, MD   15 mL at 01/21/14 [phone]  . fentaNYL (SUBLIMAZE) injection 12.5-25 mcg  12.5-25 mcg Intravenous Q1H PRN Ernestene Mention, MD   25 mcg at 01/21/14 [phone]  . irbesartan (AVAPRO) tablet 300 mg  300 mg Oral Daily Ernestene Mention, MD       And  . hydrochlorothiazide (HYDRODIURIL) tablet 25 mg  25 mg Oral Daily Ernestene Mention, MD      . ondansetron [facility]) tablet 4 mg  4 mg Oral Q6H PRN Ernestene Mention, MD       Or  . ondansetron [facility]) injection 4 mg  4 mg Intravenous Q6H PRN Ernestene Mention, MD      . pantoprazole (PROTONIX) injection 40 mg  40 mg Intravenous Q12H Ernestene Mention, MD   40 mg at 01/20/14 2119    Allergies as of 01/08/2014  . (No Known Allergies)    Family History  Problem Relation Age of Onset  . Coronary artery disease    .  Heart attack    . Hypertension    . Anxiety disorder    . Ulcers    . Cataracts    . Asthma Mother   . Heart disease Father     History   Social History  . Marital Status: Married    Spouse Name: N/A    Number of Children: 2  . Years of Education: N/A   Occupational History  . Not on file.   Social History Main Topics  . Smoking status: Former Smoker -- 4 years    Quit date: 09/05/1971  . Smokeless tobacco: Never Used  . Alcohol Use: No  . Drug Use: No  . Sexual Activity: Yes   Other Topics Concern  . Not on file   Social History Narrative  . No narrative on file    Review of Systems: As per HPI, all others  negative  Physical Exam: Vital signs in last 24 hours: Temp:  [97.7 F (36.5 C)-98.3 F (36.8 C)] 98.1 F (36.7 C) (05/20 0456) Pulse Rate:  [76-103] 98 (05/20 0456) Resp:  [16-22] 18 (05/20 0456) BP: (121-154)/(55-77) 134/70 mmHg (05/20 0456) SpO2:  [91 %-100 %] 94 % (05/20 0456) Weight:  [117.482 kg (259 lb)-119.75 kg (264 lb)] 119.75 kg (264 lb) (05/19 1619) Last BM Date: 01/20/14 General:   Alert,  Well-developed, overweight, pleasant and cooperative in NAD Head:  Normocephalic and atraumatic. Eyes:  Sclera clear, no icterus.   Conjunctiva pink. Ears:  Normal auditory acuity. Nose:  No deformity, discharge,  or lesions. Mouth:  No deformity or lesions.  Oropharynx pink & moist. Neck:  Supple; no masses or thyromegaly. Lungs:  Clear throughout to auscultation.   No wheezes, crackles, or rhonchi. No acute distress. Heart:  Regular rate and rhythm; no murmurs, clicks, rubs,  or gallops. Abdomen:  Soft, laparoscopic chole scars, mild tenderness around incisions, mild distention, no peritonitis. No masses, hepatosplenomegaly or hernias noted. Normal bowel sounds, without guarding, and without rebound.     Msk:  Symmetrical without gross deformities. Normal posture. Pulses:  Normal pulses noted. Extremities:  Without clubbing or edema. Neurologic:  Alert and  oriented x4;  grossly normal neurologically. Cervical Nodes:  No significant cervical adenopathy. Psych:  Alert and cooperative. Normal mood and affect.   Lab Results:  Recent Labs  01/20/14 1130 01/21/14 0543  WBC 7.5 11.9*  HGB 13.3 13.0  HCT 39.8 39.0  PLT 299 306   BMET  Recent Labs  01/20/14 1130 01/21/14 0543  NA 144 141  K 4.5 4.7  CL 104 104  CO2 26 25  GLUCOSE 99 113*  BUN 20 14  CREATININE 1.06 0.90  CALCIUM 9.6 8.9   LFT  Recent Labs  01/21/14 0543  PROT 6.7  ALBUMIN 3.1*  AST 84*  ALT 84*  ALKPHOS 88  BILITOT 0.4   PT/INR No results found for this basename: LABPROT, INR,  in the  last 72 hours  Studies/Results: Dg Chest 2 View  01/20/2014   CLINICAL DATA:  Preop for cholecystectomy  EXAM: CHEST  2 VIEW  COMPARISON:  None.  FINDINGS: The heart size and mediastinal contours are within normal limits. Both lungs are clear. The visualized skeletal structures are unremarkable.  IMPRESSION: No active cardiopulmonary disease.   Electronically Signed   By: Elige KoHetal  Patel   On: 01/20/2014 11:41   Dg Cholangiogram Operative  01/20/2014   CLINICAL DATA:  Cholelithiasis  EXAM: INTRAOPERATIVE CHOLANGIOGRAM  TECHNIQUE: Cholangiographic images from the C-arm fluoroscopic device  were submitted for interpretation post-operatively. Please see the procedural report for the amount of contrast and the fluoroscopy time utilized.  COMPARISON:  None.  FINDINGS: Persistent partially occlusive filling defect in the distal common bile duct just proximal to the ampulla. The visualized central intrahepatic ducts are decompressed, unremarkable. Contrast flows into the duodenum.  IMPRESSION: 1. Partially occlusive distal CBD filling defect suggesting retained stone, clot, or air bubble.   Electronically Signed   By: Oley Balmaniel  Hassell M.D.   On: 01/20/2014 14:03   Impression:  1.  Abnormal intraoperative cholangiogram, findings most consistent with choledocholithiasis.  Plan:  1.  Endoscopic retrograde cholangiopancreatography with possible biliary sphincterotomy and bile duct stone extraction. 2.  Risks (up to and including bleeding, infection, perforation, pancreatitis that can be complicated by infected necrosis and death), benefits (removal of stones, alleviating blockage, decreasing risk of cholangitis or choledocholithiasis-related pancreatitis), and alternatives (watchful waiting, percutaneous transhepatic cholangiography) of ERCP were explained to patient/family in detail and patient elects to proceed.   LOS: 1 day   Willis ModenaWilliam Alejandro Gamel  01/21/2014, 9:07 AM

## 2014-01-21 NOTE — Progress Notes (Signed)
UR completed 

## 2014-01-21 NOTE — Progress Notes (Signed)
1 Day Post-Op  Subjective: Stable and alert. She is she is thirsty and hungry and that being n.p.o. Is a little bit frustrating. Otherwise doing very well. Ambulating to bathroom. Voiding okay. Minimal pain. No respiratory difficulties.  I explained the findings of a distal CBD stone cholangiography with her. I explained that Dr. Dulce Sellarutlaw would be seeing her this morning and probably plan ERCP sphincterotomy and stone removal. She understands the need for made n.p.o.  Morning laboratory pending.  Objective: Vital signs in last 24 hours: Temp:  [97.7 F (36.5 C)-98.3 F (36.8 C)] 98.1 F (36.7 C) (05/20 0456) Pulse Rate:  [76-103] 98 (05/20 0456) Resp:  [16-22] 18 (05/20 0456) BP: (121-154)/(55-77) 134/70 mmHg (05/20 0456) SpO2:  [91 %-100 %] 94 % (05/20 0456) Weight:  [259 lb (117.482 kg)-264 lb (119.75 kg)] 264 lb (119.75 kg) (05/19 1619) Last BM Date: 01/20/14  Intake/Output from previous day: 05/19 0701 - 05/20 0700 In: 2222.5 [P.O.:60; I.V.:2112.5; IV Piggyback:50] Out: 200 [Urine:200] Intake/Output this shift: Total I/O In: 722.5 [P.O.:60; I.V.:612.5; IV Piggyback:50] Out: 200 [Urine:200]    EXAM: General appearance: alert. Cooperative. Friendly. Good spirits. No distress. Resp: clear to auscultation bilaterally GI: abdomen is soft. Nondistended.  Almost completely nontender. Wounds looked fine. Benign postop exam.  Lab Results:  Results for orders placed during the hospital encounter of 01/20/14 (from the past 24 hour(s))  CBC WITH DIFFERENTIAL     Status: None   Collection Time    01/20/14 11:30 AM      Result Value Ref Range   WBC 7.5  4.0 - 10.5 K/uL   RBC 4.55  3.87 - 5.11 MIL/uL   Hemoglobin 13.3  12.0 - 15.0 g/dL   HCT 40.939.8  81.136.0 - 91.446.0 %   MCV 87.5  78.0 - 100.0 fL   MCH 29.2  26.0 - 34.0 pg   MCHC 33.4  30.0 - 36.0 g/dL   RDW 78.214.8  95.611.5 - 21.315.5 %   Platelets 299  150 - 400 K/uL   Neutrophils Relative % 55  43 - 77 %   Neutro Abs 4.1  1.7 - 7.7 K/uL   Lymphocytes Relative 31  12 - 46 %   Lymphs Abs 2.4  0.7 - 4.0 K/uL   Monocytes Relative 11  3 - 12 %   Monocytes Absolute 0.8  0.1 - 1.0 K/uL   Eosinophils Relative 3  0 - 5 %   Eosinophils Absolute 0.2  0.0 - 0.7 K/uL   Basophils Relative 0  0 - 1 %   Basophils Absolute 0.0  0.0 - 0.1 K/uL  COMPREHENSIVE METABOLIC PANEL     Status: Abnormal   Collection Time    01/20/14 11:30 AM      Result Value Ref Range   Sodium 144  137 - 147 mEq/L   Potassium 4.5  3.7 - 5.3 mEq/L   Chloride 104  96 - 112 mEq/L   CO2 26  19 - 32 mEq/L   Glucose, Bld 99  70 - 99 mg/dL   BUN 20  6 - 23 mg/dL   Creatinine, Ser 0.861.06  0.50 - 1.10 mg/dL   Calcium 9.6  8.4 - 57.810.5 mg/dL   Total Protein 7.1  6.0 - 8.3 g/dL   Albumin 3.6  3.5 - 5.2 g/dL   AST 28  0 - 37 U/L   ALT 31  0 - 35 U/L   Alkaline Phosphatase 84  39 - 117 U/L   Total  Bilirubin 0.5  0.3 - 1.2 mg/dL   GFR calc non Af Amer 55 (*) >90 mL/min   GFR calc Af Amer 64 (*) >90 mL/min  LIPASE, BLOOD     Status: None   Collection Time    01/20/14 11:30 AM      Result Value Ref Range   Lipase 23  11 - 59 U/L     Studies/Results: Dg Chest 2 View  01/20/2014   CLINICAL DATA:  Preop for cholecystectomy  EXAM: CHEST  2 VIEW  COMPARISON:  None.  FINDINGS: The heart size and mediastinal contours are within normal limits. Both lungs are clear. The visualized skeletal structures are unremarkable.  IMPRESSION: No active cardiopulmonary disease.   Electronically Signed   By: Elige KoHetal  Patel   On: 01/20/2014 11:41   Dg Cholangiogram Operative  01/20/2014   CLINICAL DATA:  Cholelithiasis  EXAM: INTRAOPERATIVE CHOLANGIOGRAM  TECHNIQUE: Cholangiographic images from the C-arm fluoroscopic device were submitted for interpretation post-operatively. Please see the procedural report for the amount of contrast and the fluoroscopy time utilized.  COMPARISON:  None.  FINDINGS: Persistent partially occlusive filling defect in the distal common bile duct just proximal to the  ampulla. The visualized central intrahepatic ducts are decompressed, unremarkable. Contrast flows into the duodenum.  IMPRESSION: 1. Partially occlusive distal CBD filling defect suggesting retained stone, clot, or air bubble.   Electronically Signed   By: Oley Balmaniel  Hassell M.D.   On: 01/20/2014 14:03    . antiseptic oral rinse  15 mL Mouth Rinse q12n4p  .  ceFAZolin (ANCEF) IV  3 g Intravenous 3 times per day  . chlorhexidine  15 mL Mouth Rinse BID  . irbesartan  300 mg Oral Daily   And  . hydrochlorothiazide  25 mg Oral Daily  . pantoprazole (PROTONIX) IV  40 mg Intravenous Q12H     Assessment/Plan: s/p Procedure(s): LAPAROSCOPIC CHOLECYSTECTOMY WITH INTRAOPERATIVE CHOLANGIOGRAM POSSIBLE OPEN  POD #1. Laparoscopic cholecystectomy with cholangiogram Stable. No apparent surgical complications  Choledocholithiasis. Cholangiograms suggest single distal CBD stone. am. Lab work pending. Discussed with Dr. Dulce Sellarutlaw yesterday. He plans to see her this morning and hopefully ERCP today.  Essential hypertension, benign. Stable  Morbid obesity  Ambulate frequently  DVT prophylaxis. SCD hose and frequent ambulation. Pharmacologic prophylactics contraindicated due to pending ERCP and sphincterotomy.  GERD. Continue Protonix IV  @PROBHOSP @  LOS: 1 day    Ernestene MentionHaywood M Sherilynn Dieu 01/21/2014  . .prob

## 2014-01-21 NOTE — Op Note (Signed)
[facility] [address] [place], [id number]   ERCP PROCEDURE REPORT  PATIENT: Kim PetersDillon, [name]  MR# :[id number] BIRTHDATE: 1950-11-24  GENDER: Female ENDOSCOPIST: Willis ModenaWilliam Tahsin Benyo, MD REFERRED BY: Claud KelpHaywood Ingram, M.D. PROCEDURE DATE:  01/21/2014 PROCEDURE:   ERCP with sphincterotomy/papillotomy and ERCP with removal of calculus/calculi ASA CLASS:    ASA-III INDICATIONS: Abnormal intraoperative cholangiogram (bile duct stone suspected) MEDICATIONS:    General endotracheal anesthesia (GETA), Cipro 400 mg IV  DESCRIPTION OF PROCEDURE:   After the risks benefits and alternatives of the procedure were thoroughly explained, informed consent was obtained.  The Pentax Ercp Scope [id number]  endoscope was introduced through the mouth and advanced to the second portion of the duodenum .    FINDINGS:  Normal-appearing ampulla with good bile flow pre- and post-instrumentation.  Deep biliary access obtained; normal diameter bile duct (5mm) with suggestion of distal bile duct filling defect.  Post-cholecystectomy; no evidence of bile leak. Moderate biliary sphincterotomy performed with blended current without immediate complication.  Trapezoidal basket was used to remove one triangular 5mm  stone, and an extraction balloon was used to extract a second slightly smaller 4mm triangular stone.  No other stones were removed after several more balloon sweeps. Pancreatogram was not obtained, intentionally.  ENDOSCOPIC IMPRESSION:  As above.  Successful removal of two bile duct stones.  RECOMMENDATIONS: 1.  Watch for potential complications of procedure. 2.  No ASA/NSAIDs x 3 days post-sphincterotomy. 3.  Clear liquid diet, advance as tolerated. 4.  If no obvious post-procedural complications, hopefully home tomorrow from GI perspective.     _______________________________ Rosalie DoctoreSignedWillis Modena:  Starnisha Batrez, MD 01/21/2014 2:57 PM   CC:

## 2014-01-21 NOTE — Anesthesia Preprocedure Evaluation (Addendum)
Anesthesia Evaluation  Patient identified by MRN, date of birth, ID band Patient awake    Reviewed: Allergy & Precautions, H&P , NPO status , Patient's Chart, lab work & pertinent test results  Airway Mallampati: I TM Distance: >3 FB Neck ROM: Full    Dental  (+) Dental Advisory Given, Teeth Intact   Pulmonary former smoker,  breath sounds clear to auscultation        Cardiovascular hypertension, Pt. on medications Rhythm:Regular Rate:Normal     Neuro/Psych    GI/Hepatic GERD-  Medicated,  Endo/Other  Morbid obesity  Renal/GU      Musculoskeletal  (+) Arthritis -,   Abdominal   Peds  Hematology   Anesthesia Other Findings   Reproductive/Obstetrics                          Anesthesia Physical Anesthesia Plan  ASA: III  Anesthesia Plan: General   Post-op Pain Management:    Induction: Intravenous  Airway Management Planned: Oral ETT  Additional Equipment:   Intra-op Plan:   Post-operative Plan: Extubation in OR  Informed Consent: I have reviewed the patients History and Physical, chart, labs and discussed the procedure including the risks, benefits and alternatives for the proposed anesthesia with the patient or authorized representative who has indicated his/her understanding and acceptance.   Dental advisory given  Plan Discussed with: CRNA, Anesthesiologist and Surgeon  Anesthesia Plan Comments:         Anesthesia Quick Evaluation

## 2014-01-21 NOTE — Transfer of Care (Signed)
Immediate Anesthesia Transfer of Care Note  Patient: Kim Valdez  Procedure(s) Performed: Procedure(s): ENDOSCOPIC RETROGRADE CHOLANGIOPANCREATOGRAPHY (ERCP) (N/A)  Patient Location: Endoscopy Unit  Anesthesia Type:General  Level of Consciousness: awake and alert   Airway & Oxygen Therapy: Patient Spontanous Breathing and Patient connected to nasal cannula oxygen  Post-op Assessment: Report given to PACU RN, Post -op Vital signs reviewed and stable and Patient moving all extremities X 4  Post vital signs: Reviewed and stable  Complications: No apparent anesthesia complications

## 2014-01-21 NOTE — H&P (View-Only) (Signed)
[facility] Consultation Note  Referring Provider: Dr. Haywood Ingram ([facility]) Primary Care Physician:  Boyd, Tammy Lamonica, MD  Reason for Consultation:  Abnormal intraoperative cholangiogram  HPI: Kim Valdez is a 62 y.o. female admitted post-cholecystectomy for abnormal intraoperative cholangiogram.  Patient with longstanding biliary colic symptoms, with gallstones, for which she underwent laparoscopic cholecystectomy with intraoperative cholangiogram yesterday.  Pre-operative LFTs normal.  Intraoperative cholangiogram, which I have personally reviewed, showed persistent distal filling defect most consistent with bile duct stone.  Patient has some persistent right shoulder and abdominal wall discomfort, post-operatively. Is very hungry.   Past Medical History  Diagnosis Date  . Morbid obesity   . HTN (hypertension)   . HLD (hyperlipidemia)   . GERD (gastroesophageal reflux disease)   . Arthritis     lower back    Past Surgical History  Procedure Laterality Date  . Colonoscopy    . Tonsillectomy    . Cholecystectomy  01/20/2014    DR INGRAM    Prior to Admission medications   Medication Sig Start Date End Date Taking? Authorizing Provider  aspirin 81 MG tablet Take 81 mg by mouth every morning.    Yes Historical Provider, MD  esomeprazole (NEXIUM) 40 MG capsule Take 40 mg by mouth daily before breakfast.   Yes Historical Provider, MD  magnesium oxide (MAG-OX) 400 MG tablet Take 400 mg by mouth every morning.    Yes Historical Provider, MD  Multiple Minerals-Vitamins (CALCIUM CITRATE PLUS/MAGNESIUM PO) Take 1 tablet by mouth every morning.   Yes Historical Provider, MD  Multiple Vitamin (MULTIVITAMIN) tablet Take 1 tablet by mouth every morning.    Yes Historical Provider, MD  telmisartan-hydrochlorothiazide (MICARDIS HCT) 80-25 MG per tablet Take 1 tablet by mouth every morning.    Yes Historical Provider, MD    Current Facility-Administered Medications   Medication Dose Route Frequency Provider Last Rate Last Dose  . 0.9 % NaCl with KCl 20 mEq/ L  infusion   Intravenous Continuous Haywood M Ingram, MD 125 mL/hr at 01/21/14 0150    . antiseptic oral rinse (BIOTENE) solution 15 mL  15 mL Mouth Rinse q12n4p Haywood M Ingram, MD      . ceFAZolin (ANCEF) 3 g in dextrose 5 % 50 mL IVPB  3 g Intravenous 3 times per day Haywood M Ingram, MD   3 g at 01/21/14 0525  . chlorhexidine (PERIDEX) 0.12 % solution 15 mL  15 mL Mouth Rinse BID Haywood M Ingram, MD   15 mL at 01/21/14 0833  . fentaNYL (SUBLIMAZE) injection 12.5-25 mcg  12.5-25 mcg Intravenous Q1H PRN Haywood M Ingram, MD   25 mcg at 01/21/14 0839  . irbesartan (AVAPRO) tablet 300 mg  300 mg Oral Daily Haywood M Ingram, MD       And  . hydrochlorothiazide (HYDRODIURIL) tablet 25 mg  25 mg Oral Daily Haywood M Ingram, MD      . ondansetron (ZOFRAN) tablet 4 mg  4 mg Oral Q6H PRN Haywood M Ingram, MD       Or  . ondansetron (ZOFRAN) injection 4 mg  4 mg Intravenous Q6H PRN Haywood M Ingram, MD      . pantoprazole (PROTONIX) injection 40 mg  40 mg Intravenous Q12H Haywood M Ingram, MD   40 mg at 01/20/14 2119    Allergies as of 01/08/2014  . (No Known Allergies)    Family History  Problem Relation Age of Onset  . Coronary artery disease    .   Heart attack    . Hypertension    . Anxiety disorder    . Ulcers    . Cataracts    . Asthma Mother   . Heart disease Father     History   Social History  . Marital Status: Married    Spouse Name: N/A    Number of Children: 2  . Years of Education: N/A   Occupational History  . Not on file.   Social History Main Topics  . Smoking status: Former Smoker -- 4 years    Quit date: 09/05/1971  . Smokeless tobacco: Never Used  . Alcohol Use: No  . Drug Use: No  . Sexual Activity: Yes   Other Topics Concern  . Not on file   Social History Narrative  . No narrative on file    Review of Systems: As per HPI, all others  negative  Physical Exam: Vital signs in last 24 hours: Temp:  [97.7 F (36.5 C)-98.3 F (36.8 C)] 98.1 F (36.7 C) (05/20 0456) Pulse Rate:  [76-103] 98 (05/20 0456) Resp:  [16-22] 18 (05/20 0456) BP: (121-154)/(55-77) 134/70 mmHg (05/20 0456) SpO2:  [91 %-100 %] 94 % (05/20 0456) Weight:  [117.482 kg (259 lb)-119.75 kg (264 lb)] 119.75 kg (264 lb) (05/19 1619) Last BM Date: 01/20/14 General:   Alert,  Well-developed, overweight, pleasant and cooperative in NAD Head:  Normocephalic and atraumatic. Eyes:  Sclera clear, no icterus.   Conjunctiva pink. Ears:  Normal auditory acuity. Nose:  No deformity, discharge,  or lesions. Mouth:  No deformity or lesions.  Oropharynx pink & moist. Neck:  Supple; no masses or thyromegaly. Lungs:  Clear throughout to auscultation.   No wheezes, crackles, or rhonchi. No acute distress. Heart:  Regular rate and rhythm; no murmurs, clicks, rubs,  or gallops. Abdomen:  Soft, laparoscopic chole scars, mild tenderness around incisions, mild distention, no peritonitis. No masses, hepatosplenomegaly or hernias noted. Normal bowel sounds, without guarding, and without rebound.     Msk:  Symmetrical without gross deformities. Normal posture. Pulses:  Normal pulses noted. Extremities:  Without clubbing or edema. Neurologic:  Alert and  oriented x4;  grossly normal neurologically. Cervical Nodes:  No significant cervical adenopathy. Psych:  Alert and cooperative. Normal mood and affect.   Lab Results:  Recent Labs  01/20/14 1130 01/21/14 0543  WBC 7.5 11.9*  HGB 13.3 13.0  HCT 39.8 39.0  PLT 299 306   BMET  Recent Labs  01/20/14 1130 01/21/14 0543  NA 144 141  K 4.5 4.7  CL 104 104  CO2 26 25  GLUCOSE 99 113*  BUN 20 14  CREATININE 1.06 0.90  CALCIUM 9.6 8.9   LFT  Recent Labs  01/21/14 0543  PROT 6.7  ALBUMIN 3.1*  AST 84*  ALT 84*  ALKPHOS 88  BILITOT 0.4   PT/INR No results found for this basename: LABPROT, INR,  in the  last 72 hours  Studies/Results: Dg Chest 2 View  01/20/2014   CLINICAL DATA:  Preop for cholecystectomy  EXAM: CHEST  2 VIEW  COMPARISON:  None.  FINDINGS: The heart size and mediastinal contours are within normal limits. Both lungs are clear. The visualized skeletal structures are unremarkable.  IMPRESSION: No active cardiopulmonary disease.   Electronically Signed   By: Hetal  Patel   On: 01/20/2014 11:41   Dg Cholangiogram Operative  01/20/2014   CLINICAL DATA:  Cholelithiasis  EXAM: INTRAOPERATIVE CHOLANGIOGRAM  TECHNIQUE: Cholangiographic images from the C-arm fluoroscopic device   were submitted for interpretation post-operatively. Please see the procedural report for the amount of contrast and the fluoroscopy time utilized.  COMPARISON:  None.  FINDINGS: Persistent partially occlusive filling defect in the distal common bile duct just proximal to the ampulla. The visualized central intrahepatic ducts are decompressed, unremarkable. Contrast flows into the duodenum.  IMPRESSION: 1. Partially occlusive distal CBD filling defect suggesting retained stone, clot, or air bubble.   Electronically Signed   By: Daniel  Hassell M.D.   On: 01/20/2014 14:03   Impression:  1.  Abnormal intraoperative cholangiogram, findings most consistent with choledocholithiasis.  Plan:  1.  Endoscopic retrograde cholangiopancreatography with possible biliary sphincterotomy and bile duct stone extraction. 2.  Risks (up to and including bleeding, infection, perforation, pancreatitis that can be complicated by infected necrosis and death), benefits (removal of stones, alleviating blockage, decreasing risk of cholangitis or choledocholithiasis-related pancreatitis), and alternatives (watchful waiting, percutaneous transhepatic cholangiography) of ERCP were explained to patient/family in detail and patient elects to proceed.   LOS: 1 day   Jahleah Mariscal  01/21/2014, 9:07 AM   

## 2014-01-22 ENCOUNTER — Encounter (HOSPITAL_COMMUNITY): Payer: Self-pay | Admitting: Gastroenterology

## 2014-01-22 MED ORDER — HYDROCODONE-ACETAMINOPHEN 5-325 MG PO TABS: 1.0000 | ORAL_TABLET | Freq: Four times a day (QID) | ORAL | Status: AC | PRN

## 2014-01-22 MED ORDER — PANTOPRAZOLE SODIUM 40 MG PO TBEC
40.0000 mg | DELAYED_RELEASE_TABLET | Freq: Two times a day (BID) | ORAL | Status: DC
  Administered 2014-01-22: 40 mg via ORAL
  Filled 2014-01-22: qty 1

## 2014-01-22 MED ORDER — HYDROCODONE-ACETAMINOPHEN 5-325 MG PO TABS
1.0000 | ORAL_TABLET | ORAL | Status: DC | PRN
  Administered 2014-01-22: 2 via ORAL
  Filled 2014-01-22: qty 2

## 2014-01-22 NOTE — Discharge Instructions (Signed)
See above.  No sports or heavy lifting for 2-3 weeks. No restriction thereafter.  You may drive a car in one to 2 days.  Low-fat diet.  Drink lots of water and fluids and stay hydrated.

## 2014-01-22 NOTE — Discharge Summary (Signed)
Patient ID: Kim PetersConnye M Valdez [id number] 62 y.o. 1951/01/08  Admit date: 01/20/2014  Discharge date and time: 01/22/2014  Admitting Physician: Ernestene MentionHaywood M Maronda Caison  Discharge Physician: Ernestene MentionHaywood M Jinger Middlesworth  Admission Diagnoses: Gallstones  Discharge Diagnoses: Chronic cholecystitis with cholelithiasis Choledocholithiasis Obesity Essential hypertension, benign GERD  Operations: Procedure(s): Laparoscopic cholecystectomy with cholangiogram ENDOSCOPIC RETROGRADE CHOLANGIOPANCREATOGRAPHY (ERCP)  Admission Condition: good  Discharged Condition: good  Indication for Admission: Kim Valdez is a 63 y.o. female. She [facility] for evaluation of symptomatic gallstones.  This patient has a one-year history of intermittent episodes of right upper quadrant and right back pain, right shoulder pain, nausea vomiting and diarrhea. She underwent evaluation in the emergency room one year ago but no diagnosis was made. In the interim she'll have episodes off and on, often but not always postprandial. One week ago she developed another mildly severe attack of this pain and with nausea and vomiting after eating a heavy meal at church. Ultrasound report shows gallstones and gallbladder wall thickening at 6 mm, positive sonographic Murphy sign, CBD measuring 6 mm. No other abnormalities were listed. She had lab work drawn yesterday, we called, and results are not completed yet. She says she's doing okay today. Ate Chili recently without problems, still has some right flank pain. She says she has chronic right flank pain and has seen a chiropractor in the past.  No history of liver disease cardiovascular disease other than hypertension, abdominal surgery, or urologic problems.  Comorbidities include a hyperlipidemia, hypertension, GERD, and obesity.Liver function tests were normal preoperatively. She is brought to the operating room electively   Hospital Course:  On the day of admission the patient was taken to the operating room and underwent a laparoscopic cholecystectomy with cholangiogram. Operative findings revealed that The gallbladder was very thickwalled and chronically inflamed. There were stones and sludge in the cystic duct which was also very thickened. I was able to do a cholangiogram which showed normal intrahepatic and extrahepatic biliary anatomy, no obstruction with flow contrast into the duodenum, but she appeared to have a solitary distal common bile duct stone. She had a small umbilical hernia which we repaired at the end of the case.The liver, stomach, duodenum, small intestine, and large intestine were grossly normal to inspection otherwise.    Postoperatively she did well. The following morning she was stable with minimal pain and a benign abdominal exam. Choledocholithiasis was explained to the patient and her mother. Dr. Odelia GageWill Outlaw was consulted and performed an ERCP, sphincterotomy, and removed a couple of stones. She was observed overnight and did well. On the morning following the ERCP her abdomen was soft and benign. She was tolerating a liquid diet and felt well and wanted to go home. She had minimal pain. Her wounds were healing uneventfully. She was given a prescription for Norco for pain. Diet and activities were discussed. She was asked to return to see me in the office in 3 weeks.   Consults: GI  Significant Diagnostic Studies: Laboratory blood work. Intraoperative cholangiogram. ERCP.  Treatments: surgery: Laparoscopic cholecystectomy with cholangiogram, ERCP with sphincterotomy and stone removal  Disposition: Home  Patient Instructions:    Medication List         aspirin 81 MG tablet  Take 81 mg by mouth every morning.     CALCIUM CITRATE PLUS/MAGNESIUM PO  Take 1 tablet by mouth every morning.     esomeprazole 40 MG capsule  Commonly known as:  NEXIUM  Take 40 mg by mouth daily before breakfast.      HYDROcodone-acetaminophen 5-325 MG per tablet  Commonly known as:  NORCO  Take 1-2 tablets by mouth every 6 (six) hours as needed.     magnesium oxide 400 MG tablet  Commonly known as:  MAG-OX  Take 400 mg by mouth every morning.     multivitamin tablet  Take 1 tablet by mouth every morning.     telmisartan-hydrochlorothiazide 80-25 MG per tablet  Commonly known as:  MICARDIS HCT  Take 1 tablet by mouth every morning.        Activity: activity as tolerated Diet: low fat, low cholesterol diet Wound Care: none needed  Follow-up:  With Dr. Derrell LollingIngram in 3 weeks.  Signed: Angelia MouldHaywood M. Derrell LollingIngram, M.D., FACS General and minimally invasive surgery Breast and Colorectal Surgery  01/22/2014, 5:37 AM

## 2014-01-22 NOTE — Anesthesia Postprocedure Evaluation (Signed)
  Anesthesia Post-op Note  Patient: Kim Valdez  Procedure(s) Performed: Procedure(s): ENDOSCOPIC RETROGRADE CHOLANGIOPANCREATOGRAPHY (ERCP) (N/A)  Patient Location: PACU  Anesthesia Type:General  Level of Consciousness: awake and alert   Airway and Oxygen Therapy: Patient Spontanous Breathing  Post-op Pain: none  Post-op Assessment: Post-op Vital signs reviewed, Patient's Cardiovascular Status Stable and Respiratory Function Stable  Post-op Vital Signs: Reviewed  Filed Vitals:   01/22/14 0602  BP: 152/70  Pulse: 102  Temp: 36.8 C  Resp: 17    Complications: No apparent anesthesia complications

## 2014-01-22 NOTE — Progress Notes (Signed)
Subjective: Mild generalized abdominal soreness, especially when ambulating halls. Tolerating diet thus far.  Objective: Vital signs in last 24 hours: Temp:  [98.2 F (36.8 C)-98.9 F (37.2 C)] 98.2 F (36.8 C) (05/21 0602) Pulse Rate:  [80-110] 102 (05/21 0602) Resp:  [10-20] 17 (05/21 0602) BP: (110-152)/(40-78) 152/70 mmHg (05/21 0602) SpO2:  [90 %-96 %] 92 % (05/21 0602) Weight change:  Last BM Date: 01/20/14  PE: GEN:  Overweight, is in no acute distress ABD:  Soft, mild generalized tenderness  Lab Results: CBC    Component Value Date/Time   WBC 11.9* 01/21/2014 0543   WBC 7.5 11/09/2012 1011   RBC 4.45 01/21/2014 0543   RBC 5.02 11/09/2012 1011   HGB 13.0 01/21/2014 0543   HGB 14.4 11/09/2012 1011   HCT 39.0 01/21/2014 0543   HCT 45.2 11/09/2012 1011   PLT 306 01/21/2014 0543   MCV 87.6 01/21/2014 0543   MCV 90.0 11/09/2012 1011   MCH 29.2 01/21/2014 0543   MCH 28.7 11/09/2012 1011   MCHC 33.3 01/21/2014 0543   MCHC 31.9 11/09/2012 1011   RDW 14.8 01/21/2014 0543   LYMPHSABS 2.4 01/20/2014 1130   MONOABS 0.8 01/20/2014 1130   EOSABS 0.2 01/20/2014 1130   BASOSABS 0.0 01/20/2014 1130   CMP     Component Value Date/Time   NA 141 01/21/2014 0543   K 4.7 01/21/2014 0543   CL 104 01/21/2014 0543   CO2 25 01/21/2014 0543   GLUCOSE 113* 01/21/2014 0543   BUN 14 01/21/2014 0543   CREATININE 0.90 01/21/2014 0543   CREATININE 1.03 11/09/2012 1007   CALCIUM 8.9 01/21/2014 0543   PROT 6.7 01/21/2014 0543   ALBUMIN 3.1* 01/21/2014 0543   AST 84* 01/21/2014 0543   ALT 84* 01/21/2014 0543   ALKPHOS 88 01/21/2014 0543   BILITOT 0.4 01/21/2014 0543   GFRNONAA 67* 01/21/2014 0543   GFRAA 78* 01/21/2014 0543   Assessment:  1.  Choledocholithiasis, post ERCP with sphincterotomy and bile duct stone extraction.  Doing well day after procedure.  Plan:  1.  Advancing diet as tolerated. 2.  Avoid ASA/NSAIDs x 3 days post-sphincterotomy. 3.  Dr. Derrell LollingIngram planning to discharge patient today. 4.  Patient can  follow-up with [facility] on as-needed basis. 5.  Will sign-off; please call with questions; thank you for the consult.  Kim ModenaWilliam Halynn Valdez 01/22/2014, 8:42 AM

## 2014-01-22 NOTE — Progress Notes (Signed)
Discharge instructions and prescription for norco given and explained to pt.  Pt. Verbalized understanding of all orders and denies any questions. IV removed by NT.  Pt. In stable conditon for discharge home.  Pt. Discharged to home with family. Vanice Sarahaylor L Thompson

## 2014-01-23 ENCOUNTER — Encounter (HOSPITAL_COMMUNITY): Payer: Self-pay | Admitting: General Surgery

## 2014-02-04 ENCOUNTER — Ambulatory Visit (INDEPENDENT_AMBULATORY_CARE_PROVIDER_SITE_OTHER): Payer: 59 | Admitting: General Surgery

## 2014-02-04 ENCOUNTER — Encounter (INDEPENDENT_AMBULATORY_CARE_PROVIDER_SITE_OTHER): Payer: Self-pay | Admitting: General Surgery

## 2014-02-04 VITALS — BP 126/76 | HR 70 | Temp 97.9°F | Resp 16 | Ht 63.0 in | Wt 255.2 lb

## 2014-02-04 DIAGNOSIS — K8020 Calculus of gallbladder without cholecystitis without obstruction: Secondary | ICD-10-CM

## 2014-02-04 NOTE — Progress Notes (Signed)
Patient ID: Kim Valdez, female   DOB: 22-Jun-1951, 62 y.o.   MRN: [id number] History: This patient underwent laparoscopic cholecystectomy with cholangiogram of 01/20/2014. Cholangiogram showed a solitary distal common bile duct stone which was removed by Dr. Dulce Sellar via ERCP the following day. She has done well since discharge. Resume normal diet. No diarrhea. No pain. No nausea. No wound problems  Exam: Patient looks well. Husband is with her Abdomen soft and nontender. Wounds looked fine  Assessment: chronic cholecystitis with cholelithiasis Choledocholithiasis Uneventful recovery following laparoscopic cholecystectomy cholangiogram and postop ERCP sphincterotomy  Plan: Low fat diet Resume normal activities in one more week Return to see me as necessary.    Angelia Mould. Derrell Lolling, M.D., [facility] General and Minimally invasive Surgery Breast and Colorectal Surgery Office:   [phone] Pager:   [phone]

## 2014-02-04 NOTE — Patient Instructions (Signed)
You are recovering from your cholecystectomy and from the postop ERCP without any obvious complications.  You may resume normal activities after one more week  Low-fat diet is advised  Return to see Dr. Derrell Lolling if necessary

## 2014-02-10 ENCOUNTER — Encounter (INDEPENDENT_AMBULATORY_CARE_PROVIDER_SITE_OTHER): Payer: Self-pay

## 2014-07-27 ENCOUNTER — Other Ambulatory Visit: Payer: Self-pay

## 2014-07-27 DIAGNOSIS — Z1231 Encounter for screening mammogram for malignant neoplasm of breast: Secondary | ICD-10-CM

## 2014-08-19 ENCOUNTER — Ambulatory Visit
Admission: RE | Admit: 2014-08-19 | Discharge: 2014-08-19 | Disposition: A | Discharge status: Home / Self Care | Payer: 59 | Source: Ambulatory Visit

## 2014-08-19 DIAGNOSIS — Z1231 Encounter for screening mammogram for malignant neoplasm of breast: Secondary | ICD-10-CM

## 2014-11-05 ENCOUNTER — Other Ambulatory Visit: Payer: Self-pay | Admitting: Gastroenterology

## 2015-01-19 ENCOUNTER — Other Ambulatory Visit: Payer: Self-pay | Admitting: Gastroenterology

## 2015-01-21 ENCOUNTER — Encounter (HOSPITAL_COMMUNITY): Payer: Self-pay | Admitting: *Deleted

## 2015-01-24 NOTE — Anesthesia Preprocedure Evaluation (Addendum)
Anesthesia Evaluation  Patient identified by MRN, date of birth, ID band Patient awake    Reviewed: Allergy & Precautions, H&P , NPO status , Patient's Chart, lab work & pertinent test results, reviewed documented beta blocker date and time   Airway Mallampati: I  TM Distance: >3 FB Neck ROM: Full    Dental  (+) Dental Advisory Given, Teeth Intact   Pulmonary former smoker,  breath sounds clear to auscultation        Cardiovascular hypertension, Pt. on medications Rhythm:Regular Rate:Normal     Neuro/Psych    GI/Hepatic GERD-  Medicated,  Endo/Other  Morbid obesity  Renal/GU      Musculoskeletal  (+) Arthritis -,   Abdominal (+)  Abdomen: soft.    Peds  Hematology   Anesthesia Other Findings   Reproductive/Obstetrics                           Anesthesia Physical  Anesthesia Plan  ASA: III  Anesthesia Plan: MAC   Post-op Pain Management:    Induction: Intravenous  Airway Management Planned: Nasal Cannula  Additional Equipment:   Intra-op Plan:   Post-operative Plan: Extubation in OR  Informed Consent: I have reviewed the patients History and Physical, chart, labs and discussed the procedure including the risks, benefits and alternatives for the proposed anesthesia with the patient or authorized representative who has indicated his/her understanding and acceptance.   Dental advisory given  Plan Discussed with: CRNA, Anesthesiologist and Surgeon  Anesthesia Plan Comments:        Anesthesia Quick Evaluation

## 2015-01-26 ENCOUNTER — Ambulatory Visit (HOSPITAL_COMMUNITY): Payer: 59 | Admitting: Anesthesiology

## 2015-01-26 ENCOUNTER — Encounter (HOSPITAL_COMMUNITY)
Admission: RE | Disposition: A | Discharge status: Home / Self Care | Payer: Self-pay | Source: Ambulatory Visit | Attending: Gastroenterology

## 2015-01-26 ENCOUNTER — Ambulatory Visit (HOSPITAL_COMMUNITY)
Admission: RE | Admit: 2015-01-26 | Discharge: 2015-01-26 | Disposition: A | Discharge status: Home / Self Care | Payer: 59 | Source: Ambulatory Visit | Attending: Gastroenterology | Admitting: Gastroenterology

## 2015-01-26 ENCOUNTER — Encounter (HOSPITAL_COMMUNITY): Payer: Self-pay | Admitting: *Deleted

## 2015-01-26 DIAGNOSIS — Z7982 Long term (current) use of aspirin: Secondary | ICD-10-CM | POA: Insufficient documentation

## 2015-01-26 DIAGNOSIS — Z79899 Other long term (current) drug therapy: Secondary | ICD-10-CM | POA: Insufficient documentation

## 2015-01-26 DIAGNOSIS — K219 Gastro-esophageal reflux disease without esophagitis: Secondary | ICD-10-CM | POA: Diagnosis not present

## 2015-01-26 DIAGNOSIS — M109 Gout, unspecified: Secondary | ICD-10-CM | POA: Insufficient documentation

## 2015-01-26 DIAGNOSIS — Z87891 Personal history of nicotine dependence: Secondary | ICD-10-CM | POA: Diagnosis not present

## 2015-01-26 DIAGNOSIS — Z6841 Body Mass Index (BMI) 40.0 and over, adult: Secondary | ICD-10-CM | POA: Insufficient documentation

## 2015-01-26 DIAGNOSIS — E780 Pure hypercholesterolemia: Secondary | ICD-10-CM | POA: Diagnosis not present

## 2015-01-26 DIAGNOSIS — I10 Essential (primary) hypertension: Secondary | ICD-10-CM | POA: Diagnosis not present

## 2015-01-26 DIAGNOSIS — Z9049 Acquired absence of other specified parts of digestive tract: Secondary | ICD-10-CM | POA: Insufficient documentation

## 2015-01-26 DIAGNOSIS — E785 Hyperlipidemia, unspecified: Secondary | ICD-10-CM | POA: Insufficient documentation

## 2015-01-26 DIAGNOSIS — M1990 Unspecified osteoarthritis, unspecified site: Secondary | ICD-10-CM | POA: Diagnosis not present

## 2015-01-26 DIAGNOSIS — Z1211 Encounter for screening for malignant neoplasm of colon: Secondary | ICD-10-CM | POA: Insufficient documentation

## 2015-01-26 HISTORY — PX: COLONOSCOPY WITH PROPOFOL: SHX5780

## 2015-01-26 SURGERY — COLONOSCOPY WITH PROPOFOL
Anesthesia: Monitor Anesthesia Care

## 2015-01-26 MED ORDER — LACTATED RINGERS IV SOLN
INTRAVENOUS | Status: DC
  Administered 2015-01-26: 1000 mL via INTRAVENOUS

## 2015-01-26 MED ORDER — SODIUM CHLORIDE 0.9 % IV SOLN: INTRAVENOUS | Status: DC

## 2015-01-26 MED ORDER — LIDOCAINE HCL (CARDIAC) 20 MG/ML IV SOLN
INTRAVENOUS | Status: AC
  Filled 2015-01-26: qty 5

## 2015-01-26 MED ORDER — PROPOFOL 10 MG/ML IV BOLUS
INTRAVENOUS | Status: DC | PRN
  Administered 2015-01-26: 20 mg via INTRAVENOUS
  Administered 2015-01-26 (×2): 30 mg via INTRAVENOUS
  Administered 2015-01-26 (×2): 20 mg via INTRAVENOUS
  Administered 2015-01-26 (×2): 30 mg via INTRAVENOUS
  Administered 2015-01-26: 20 mg via INTRAVENOUS

## 2015-01-26 MED ORDER — PROPOFOL 10 MG/ML IV BOLUS
INTRAVENOUS | Status: AC
  Filled 2015-01-26: qty 20

## 2015-01-26 MED ORDER — LIDOCAINE HCL (PF) 2 % IJ SOLN
INTRAMUSCULAR | Status: DC | PRN
  Administered 2015-01-26: 100 mg via INTRADERMAL

## 2015-01-26 MED ORDER — PROMETHAZINE HCL 25 MG/ML IJ SOLN
6.2500 mg | INTRAMUSCULAR | Status: DC | PRN
Start: 2015-01-26 — End: 2015-01-26

## 2015-01-26 MED ORDER — MEPERIDINE HCL 100 MG/ML IJ SOLN: 6.2500 mg | INTRAMUSCULAR | Status: DC | PRN

## 2015-01-26 SURGICAL SUPPLY — 22 items

## 2015-01-26 NOTE — H&P (Signed)
  Procedure: Screening colonoscopy.  History: The patient is a 64 year old female born 1951/02/19. She is scheduled to undergo a screening colonoscopy today. Last year, the patient underwent laparoscopic cholecystectomy for gallstones and post operative ERCP with biliary sphincterotomy to remove common bile duct stones.  Medication allergies: None  Past medical history: Laparoscopic cholecystectomy followed by ERCP with biliary sphincterotomy for gallstones. Tonsillectomy. Hypertension. Gastroesophageal reflux. Hypercholesterolemia. Gout.  Exam: The patient is alert and lying comfortably on the endoscopy stretcher. Abdomen is soft and nontender to palpation. Lungs are clear to auscultation. Cardiac exam reveals a regular rhythm.  Plan: Proceed with screening colonoscopy

## 2015-01-26 NOTE — Anesthesia Postprocedure Evaluation (Signed)
  Anesthesia Post-op Note  Patient: Kim Valdez  Procedure(s) Performed: Procedure(s): COLONOSCOPY WITH PROPOFOL (N/A)  Patient Location: PACU  Anesthesia Type:MAC  Level of Consciousness: awake and alert   Airway and Oxygen Therapy: Patient Spontanous Breathing  Post-op Pain: none  Post-op Assessment: Post-op Vital signs reviewed and Patient's Cardiovascular Status Stable  Post-op Vital Signs: Reviewed and stable  Last Vitals:  Filed Vitals:   01/26/15 1113  BP: 137/76  Pulse: 78  Temp:   Resp: 24    Complications: No apparent anesthesia complications

## 2015-01-26 NOTE — Op Note (Signed)
Procedure: Screening colonoscopy  Endoscopist: Danise EdgeMartin Johnson  Premedication: Propofol administered by anesthesia  Procedure: The patient was placed in the left lateral decubitus position. Anal inspection and digital rectal exam were normal. The Pentax pediatric colonoscope was introduced into the rectum and advanced to the cecum. A normal-appearing ileocecal valve and appendiceal orifice were identified. Colonic preparation for the exam today was good. Withdrawal time was 8 minutes  Rectum. Normal. Retroflexed view of the distal rectum was normal  Sigmoid colon and descending colon. Normal  Splenic flexure. Normal  Transverse colon. Normal  Hepatic flexure. Normal  Ascending colon. Normal  Cecum and ileocecal valve. Normal.  Assessment: Normal screening colonoscopy  Recommendation: Schedule repeat screening colonoscopy in 10 years

## 2015-01-26 NOTE — Discharge Instructions (Signed)

## 2015-01-26 NOTE — Transfer of Care (Signed)
Immediate Anesthesia Transfer of Care Note  Patient: Kim PetersConnye M Jozwiak  Procedure(s) Performed: Procedure(s): COLONOSCOPY WITH PROPOFOL (N/A)  Patient Location: PACU  Anesthesia Type:MAC  Level of Consciousness:  sedated, patient cooperative and responds to stimulation  Airway & Oxygen Therapy:Patient Spontanous Breathing and Patient connected to face mask oxgen  Post-op Assessment:  Report given to PACU RN and Post -op Vital signs reviewed and stable  Post vital signs:  Reviewed and stable  Last Vitals:  Filed Vitals:   01/26/15 1008  BP: 149/70  Pulse: 82  Resp: 16    Complications: No apparent anesthesia complications

## 2015-01-27 ENCOUNTER — Encounter (HOSPITAL_COMMUNITY): Payer: Self-pay | Admitting: Gastroenterology

## 2015-07-22 ENCOUNTER — Other Ambulatory Visit: Payer: Self-pay

## 2015-07-22 DIAGNOSIS — Z1231 Encounter for screening mammogram for malignant neoplasm of breast: Secondary | ICD-10-CM

## 2015-08-26 ENCOUNTER — Ambulatory Visit
Admission: RE | Admit: 2015-08-26 | Discharge: 2015-08-26 | Disposition: A | Discharge status: Home / Self Care | Payer: 59 | Source: Ambulatory Visit

## 2015-08-26 DIAGNOSIS — Z1231 Encounter for screening mammogram for malignant neoplasm of breast: Secondary | ICD-10-CM

## 2016-06-06 DIAGNOSIS — R635 Abnormal weight gain: Secondary | ICD-10-CM | POA: Diagnosis not present

## 2016-06-06 DIAGNOSIS — E2839 Other primary ovarian failure: Secondary | ICD-10-CM | POA: Diagnosis not present

## 2016-06-06 DIAGNOSIS — Z0000 Encounter for general adult medical examination without abnormal findings: Secondary | ICD-10-CM | POA: Diagnosis not present

## 2016-06-06 DIAGNOSIS — L659 Nonscarring hair loss, unspecified: Secondary | ICD-10-CM | POA: Diagnosis not present

## 2016-06-06 DIAGNOSIS — Z23 Encounter for immunization: Secondary | ICD-10-CM | POA: Diagnosis not present

## 2016-06-06 DIAGNOSIS — Z124 Encounter for screening for malignant neoplasm of cervix: Secondary | ICD-10-CM | POA: Diagnosis not present

## 2016-06-08 ENCOUNTER — Other Ambulatory Visit: Payer: Self-pay | Admitting: Family Medicine

## 2016-06-08 DIAGNOSIS — E2839 Other primary ovarian failure: Secondary | ICD-10-CM

## 2016-06-30 ENCOUNTER — Ambulatory Visit
Admission: RE | Admit: 2016-06-30 | Discharge: 2016-06-30 | Disposition: A | Discharge status: Home / Self Care | Payer: Medicare PPO | Source: Ambulatory Visit | Attending: Family Medicine | Admitting: Family Medicine

## 2016-06-30 DIAGNOSIS — Z13820 Encounter for screening for osteoporosis: Secondary | ICD-10-CM | POA: Diagnosis not present

## 2016-06-30 DIAGNOSIS — Z780 Asymptomatic menopausal state: Secondary | ICD-10-CM | POA: Diagnosis not present

## 2016-06-30 DIAGNOSIS — E2839 Other primary ovarian failure: Secondary | ICD-10-CM

## 2016-08-01 ENCOUNTER — Other Ambulatory Visit: Payer: Self-pay | Admitting: Family Medicine

## 2016-08-01 DIAGNOSIS — Z1231 Encounter for screening mammogram for malignant neoplasm of breast: Secondary | ICD-10-CM

## 2016-09-06 ENCOUNTER — Ambulatory Visit
Admission: RE | Admit: 2016-09-06 | Discharge: 2016-09-06 | Disposition: A | Discharge status: Home / Self Care | Payer: Medicare PPO | Source: Ambulatory Visit | Attending: Family Medicine | Admitting: Family Medicine

## 2016-09-06 DIAGNOSIS — Z1231 Encounter for screening mammogram for malignant neoplasm of breast: Secondary | ICD-10-CM | POA: Diagnosis not present

## 2016-11-01 DIAGNOSIS — I10 Essential (primary) hypertension: Secondary | ICD-10-CM | POA: Diagnosis not present

## 2016-11-01 DIAGNOSIS — J069 Acute upper respiratory infection, unspecified: Secondary | ICD-10-CM | POA: Diagnosis not present

## 2017-01-03 DIAGNOSIS — Z713 Dietary counseling and surveillance: Secondary | ICD-10-CM | POA: Diagnosis not present

## 2017-01-03 DIAGNOSIS — Z7189 Other specified counseling: Secondary | ICD-10-CM | POA: Diagnosis not present

## 2017-01-03 DIAGNOSIS — Z9884 Bariatric surgery status: Secondary | ICD-10-CM | POA: Diagnosis not present

## 2017-01-03 DIAGNOSIS — I10 Essential (primary) hypertension: Secondary | ICD-10-CM | POA: Diagnosis not present

## 2017-01-03 DIAGNOSIS — E785 Hyperlipidemia, unspecified: Secondary | ICD-10-CM | POA: Diagnosis not present

## 2017-02-05 DIAGNOSIS — N950 Postmenopausal bleeding: Secondary | ICD-10-CM | POA: Diagnosis not present

## 2017-02-05 DIAGNOSIS — R319 Hematuria, unspecified: Secondary | ICD-10-CM | POA: Diagnosis not present

## 2017-02-06 DIAGNOSIS — N950 Postmenopausal bleeding: Secondary | ICD-10-CM | POA: Diagnosis not present

## 2017-02-06 DIAGNOSIS — R938 Abnormal findings on diagnostic imaging of other specified body structures: Secondary | ICD-10-CM | POA: Diagnosis not present

## 2017-02-07 DIAGNOSIS — N840 Polyp of corpus uteri: Secondary | ICD-10-CM | POA: Diagnosis not present

## 2017-02-07 DIAGNOSIS — Z6841 Body Mass Index (BMI) 40.0 and over, adult: Secondary | ICD-10-CM | POA: Diagnosis not present

## 2017-02-07 DIAGNOSIS — N950 Postmenopausal bleeding: Secondary | ICD-10-CM | POA: Diagnosis not present

## 2017-02-20 DIAGNOSIS — E785 Hyperlipidemia, unspecified: Secondary | ICD-10-CM | POA: Diagnosis not present

## 2017-02-20 DIAGNOSIS — Z7189 Other specified counseling: Secondary | ICD-10-CM | POA: Diagnosis not present

## 2017-02-20 DIAGNOSIS — Z9884 Bariatric surgery status: Secondary | ICD-10-CM | POA: Diagnosis not present

## 2017-02-20 DIAGNOSIS — Z713 Dietary counseling and surveillance: Secondary | ICD-10-CM | POA: Diagnosis not present

## 2017-02-20 DIAGNOSIS — I10 Essential (primary) hypertension: Secondary | ICD-10-CM | POA: Diagnosis not present

## 2017-04-04 DIAGNOSIS — I10 Essential (primary) hypertension: Secondary | ICD-10-CM | POA: Diagnosis not present

## 2017-05-18 DIAGNOSIS — I10 Essential (primary) hypertension: Secondary | ICD-10-CM | POA: Diagnosis not present

## 2017-05-18 DIAGNOSIS — Z01818 Encounter for other preprocedural examination: Secondary | ICD-10-CM | POA: Diagnosis not present

## 2017-06-12 DIAGNOSIS — Z23 Encounter for immunization: Secondary | ICD-10-CM | POA: Diagnosis not present

## 2017-06-13 DIAGNOSIS — K449 Diaphragmatic hernia without obstruction or gangrene: Secondary | ICD-10-CM | POA: Diagnosis not present

## 2017-06-13 DIAGNOSIS — K317 Polyp of stomach and duodenum: Secondary | ICD-10-CM | POA: Diagnosis not present

## 2017-06-13 DIAGNOSIS — E785 Hyperlipidemia, unspecified: Secondary | ICD-10-CM | POA: Diagnosis not present

## 2017-06-13 DIAGNOSIS — I10 Essential (primary) hypertension: Secondary | ICD-10-CM | POA: Diagnosis not present

## 2017-06-13 DIAGNOSIS — K209 Esophagitis, unspecified: Secondary | ICD-10-CM | POA: Diagnosis not present

## 2017-06-13 DIAGNOSIS — K210 Gastro-esophageal reflux disease with esophagitis: Secondary | ICD-10-CM | POA: Diagnosis not present

## 2017-06-13 DIAGNOSIS — K529 Noninfective gastroenteritis and colitis, unspecified: Secondary | ICD-10-CM | POA: Diagnosis not present

## 2017-06-13 DIAGNOSIS — Z01818 Encounter for other preprocedural examination: Secondary | ICD-10-CM | POA: Diagnosis not present

## 2017-06-13 DIAGNOSIS — K2980 Duodenitis without bleeding: Secondary | ICD-10-CM | POA: Diagnosis not present

## 2017-07-09 DIAGNOSIS — Z7189 Other specified counseling: Secondary | ICD-10-CM | POA: Diagnosis not present

## 2017-07-09 DIAGNOSIS — E785 Hyperlipidemia, unspecified: Secondary | ICD-10-CM | POA: Diagnosis not present

## 2017-07-09 DIAGNOSIS — Z713 Dietary counseling and surveillance: Secondary | ICD-10-CM | POA: Diagnosis not present

## 2017-07-09 DIAGNOSIS — I10 Essential (primary) hypertension: Secondary | ICD-10-CM | POA: Diagnosis not present

## 2017-07-24 DIAGNOSIS — G4700 Insomnia, unspecified: Secondary | ICD-10-CM | POA: Diagnosis not present

## 2017-07-24 DIAGNOSIS — K210 Gastro-esophageal reflux disease with esophagitis: Secondary | ICD-10-CM | POA: Diagnosis not present

## 2017-07-24 DIAGNOSIS — K37 Unspecified appendicitis: Secondary | ICD-10-CM | POA: Diagnosis not present

## 2017-07-24 DIAGNOSIS — G4709 Other insomnia: Secondary | ICD-10-CM | POA: Diagnosis not present

## 2017-07-24 DIAGNOSIS — M899 Disorder of bone, unspecified: Secondary | ICD-10-CM | POA: Diagnosis not present

## 2017-07-24 DIAGNOSIS — I10 Essential (primary) hypertension: Secondary | ICD-10-CM | POA: Diagnosis not present

## 2017-07-24 DIAGNOSIS — K8070 Calculus of gallbladder and bile duct without cholecystitis without obstruction: Secondary | ICD-10-CM | POA: Diagnosis not present

## 2017-07-24 DIAGNOSIS — M1A0720 Idiopathic chronic gout, left ankle and foot, without tophus (tophi): Secondary | ICD-10-CM | POA: Diagnosis not present

## 2017-07-24 DIAGNOSIS — Z01810 Encounter for preprocedural cardiovascular examination: Secondary | ICD-10-CM | POA: Diagnosis not present

## 2017-07-24 DIAGNOSIS — Z6841 Body Mass Index (BMI) 40.0 and over, adult: Secondary | ICD-10-CM | POA: Diagnosis not present

## 2017-07-24 DIAGNOSIS — E785 Hyperlipidemia, unspecified: Secondary | ICD-10-CM | POA: Diagnosis not present

## 2017-07-31 ENCOUNTER — Other Ambulatory Visit: Payer: Self-pay | Admitting: Family Medicine

## 2017-07-31 DIAGNOSIS — Z1231 Encounter for screening mammogram for malignant neoplasm of breast: Secondary | ICD-10-CM

## 2017-08-22 DIAGNOSIS — Z8679 Personal history of other diseases of the circulatory system: Secondary | ICD-10-CM | POA: Diagnosis not present

## 2017-08-22 DIAGNOSIS — E782 Mixed hyperlipidemia: Secondary | ICD-10-CM | POA: Diagnosis not present

## 2017-08-30 DIAGNOSIS — Z23 Encounter for immunization: Secondary | ICD-10-CM | POA: Diagnosis not present

## 2017-09-07 ENCOUNTER — Ambulatory Visit
Admission: RE | Admit: 2017-09-07 | Discharge: 2017-09-07 | Disposition: A | Discharge status: Home / Self Care | Payer: Medicare PPO | Source: Ambulatory Visit | Attending: Family Medicine | Admitting: Family Medicine

## 2017-09-07 DIAGNOSIS — Z1231 Encounter for screening mammogram for malignant neoplasm of breast: Secondary | ICD-10-CM | POA: Diagnosis not present

## 2017-10-30 DIAGNOSIS — Z0000 Encounter for general adult medical examination without abnormal findings: Secondary | ICD-10-CM | POA: Diagnosis not present

## 2017-11-01 DIAGNOSIS — H6121 Impacted cerumen, right ear: Secondary | ICD-10-CM | POA: Diagnosis not present

## 2017-11-01 DIAGNOSIS — Z0000 Encounter for general adult medical examination without abnormal findings: Secondary | ICD-10-CM | POA: Diagnosis not present

## 2017-11-01 DIAGNOSIS — R319 Hematuria, unspecified: Secondary | ICD-10-CM | POA: Diagnosis not present

## 2017-11-01 DIAGNOSIS — G4700 Insomnia, unspecified: Secondary | ICD-10-CM | POA: Diagnosis not present

## 2017-11-01 DIAGNOSIS — Z124 Encounter for screening for malignant neoplasm of cervix: Secondary | ICD-10-CM | POA: Diagnosis not present

## 2017-11-02 DIAGNOSIS — R319 Hematuria, unspecified: Secondary | ICD-10-CM | POA: Diagnosis not present

## 2018-02-04 DIAGNOSIS — Z903 Acquired absence of stomach [part of]: Secondary | ICD-10-CM | POA: Diagnosis not present

## 2018-02-04 DIAGNOSIS — K219 Gastro-esophageal reflux disease without esophagitis: Secondary | ICD-10-CM | POA: Diagnosis not present

## 2018-02-04 DIAGNOSIS — E782 Mixed hyperlipidemia: Secondary | ICD-10-CM | POA: Diagnosis not present

## 2018-02-04 DIAGNOSIS — E669 Obesity, unspecified: Secondary | ICD-10-CM | POA: Diagnosis not present

## 2018-03-26 DIAGNOSIS — D225 Melanocytic nevi of trunk: Secondary | ICD-10-CM | POA: Diagnosis not present

## 2018-03-26 DIAGNOSIS — H5203 Hypermetropia, bilateral: Secondary | ICD-10-CM | POA: Diagnosis not present

## 2018-03-26 DIAGNOSIS — L820 Inflamed seborrheic keratosis: Secondary | ICD-10-CM | POA: Diagnosis not present

## 2018-03-26 DIAGNOSIS — L308 Other specified dermatitis: Secondary | ICD-10-CM | POA: Diagnosis not present

## 2018-03-26 DIAGNOSIS — D1801 Hemangioma of skin and subcutaneous tissue: Secondary | ICD-10-CM | POA: Diagnosis not present

## 2018-03-26 DIAGNOSIS — H2513 Age-related nuclear cataract, bilateral: Secondary | ICD-10-CM | POA: Diagnosis not present

## 2018-03-26 DIAGNOSIS — L821 Other seborrheic keratosis: Secondary | ICD-10-CM | POA: Diagnosis not present

## 2018-03-26 DIAGNOSIS — L814 Other melanin hyperpigmentation: Secondary | ICD-10-CM | POA: Diagnosis not present

## 2018-08-20 ENCOUNTER — Other Ambulatory Visit: Payer: Self-pay | Admitting: Family Medicine

## 2018-08-20 DIAGNOSIS — Z1231 Encounter for screening mammogram for malignant neoplasm of breast: Secondary | ICD-10-CM

## 2018-08-26 DIAGNOSIS — Z903 Acquired absence of stomach [part of]: Secondary | ICD-10-CM | POA: Diagnosis not present

## 2018-08-26 DIAGNOSIS — E669 Obesity, unspecified: Secondary | ICD-10-CM | POA: Diagnosis not present

## 2018-08-26 DIAGNOSIS — K219 Gastro-esophageal reflux disease without esophagitis: Secondary | ICD-10-CM | POA: Diagnosis not present

## 2018-10-02 ENCOUNTER — Ambulatory Visit: Payer: Medicare PPO

## 2018-10-25 ENCOUNTER — Ambulatory Visit: Payer: Medicare PPO

## 2018-10-30 DIAGNOSIS — Z8739 Personal history of other diseases of the musculoskeletal system and connective tissue: Secondary | ICD-10-CM | POA: Diagnosis not present

## 2018-10-30 DIAGNOSIS — M25521 Pain in right elbow: Secondary | ICD-10-CM | POA: Diagnosis not present

## 2018-10-30 DIAGNOSIS — E785 Hyperlipidemia, unspecified: Secondary | ICD-10-CM | POA: Diagnosis not present

## 2018-10-30 DIAGNOSIS — Z87898 Personal history of other specified conditions: Secondary | ICD-10-CM | POA: Diagnosis not present

## 2018-10-30 DIAGNOSIS — Z1331 Encounter for screening for depression: Secondary | ICD-10-CM | POA: Diagnosis not present

## 2018-10-30 DIAGNOSIS — M1A0720 Idiopathic chronic gout, left ankle and foot, without tophus (tophi): Secondary | ICD-10-CM | POA: Diagnosis not present

## 2018-11-04 ENCOUNTER — Ambulatory Visit: Payer: Medicare PPO

## 2018-11-20 DIAGNOSIS — Z1231 Encounter for screening mammogram for malignant neoplasm of breast: Secondary | ICD-10-CM | POA: Diagnosis not present

## 2019-02-11 DIAGNOSIS — M8588 Other specified disorders of bone density and structure, other site: Secondary | ICD-10-CM | POA: Diagnosis not present

## 2019-02-11 DIAGNOSIS — M85852 Other specified disorders of bone density and structure, left thigh: Secondary | ICD-10-CM | POA: Diagnosis not present

## 2019-04-03 DIAGNOSIS — R197 Diarrhea, unspecified: Secondary | ICD-10-CM | POA: Diagnosis not present

## 2019-04-03 DIAGNOSIS — D649 Anemia, unspecified: Secondary | ICD-10-CM | POA: Diagnosis not present

## 2019-04-04 DIAGNOSIS — R197 Diarrhea, unspecified: Secondary | ICD-10-CM | POA: Diagnosis not present

## 2019-06-06 DIAGNOSIS — Z23 Encounter for immunization: Secondary | ICD-10-CM | POA: Diagnosis not present
